# Patient Record
Sex: Male | Born: 1955 | Race: White | Hispanic: No | Marital: Single | Smoking: Never smoker
Health system: Southern US, Community
[De-identification: ages and names within clinical notes are randomized; demographics above are authoritative.]

## PROBLEM LIST (undated history)

## (undated) DIAGNOSIS — E1121 Type 2 diabetes mellitus with diabetic nephropathy: Secondary | ICD-10-CM

## (undated) DIAGNOSIS — E119 Type 2 diabetes mellitus without complications: Secondary | ICD-10-CM

## (undated) DIAGNOSIS — I38 Endocarditis, valve unspecified: Secondary | ICD-10-CM

## (undated) DIAGNOSIS — T884XXA Failed or difficult intubation, initial encounter: Secondary | ICD-10-CM

## (undated) DIAGNOSIS — Z8679 Personal history of other diseases of the circulatory system: Secondary | ICD-10-CM

## (undated) DIAGNOSIS — G4730 Sleep apnea, unspecified: Secondary | ICD-10-CM

## (undated) DIAGNOSIS — R339 Retention of urine, unspecified: Secondary | ICD-10-CM

## (undated) DIAGNOSIS — D509 Iron deficiency anemia, unspecified: Secondary | ICD-10-CM

## (undated) DIAGNOSIS — I330 Acute and subacute infective endocarditis: Secondary | ICD-10-CM

## (undated) DIAGNOSIS — M19019 Primary osteoarthritis, unspecified shoulder: Secondary | ICD-10-CM

## (undated) DIAGNOSIS — M549 Dorsalgia, unspecified: Secondary | ICD-10-CM

## (undated) DIAGNOSIS — J45909 Unspecified asthma, uncomplicated: Secondary | ICD-10-CM

## (undated) DIAGNOSIS — N35919 Unspecified urethral stricture, male, unspecified site: Secondary | ICD-10-CM

## (undated) DIAGNOSIS — T4145XA Adverse effect of unspecified anesthetic, initial encounter: Secondary | ICD-10-CM

## (undated) DIAGNOSIS — I10 Essential (primary) hypertension: Secondary | ICD-10-CM

## (undated) DIAGNOSIS — K429 Umbilical hernia without obstruction or gangrene: Secondary | ICD-10-CM

## (undated) DIAGNOSIS — M25569 Pain in unspecified knee: Secondary | ICD-10-CM

## (undated) DIAGNOSIS — N179 Acute kidney failure, unspecified: Secondary | ICD-10-CM

## (undated) DIAGNOSIS — E785 Hyperlipidemia, unspecified: Secondary | ICD-10-CM

## (undated) DIAGNOSIS — G4733 Obstructive sleep apnea (adult) (pediatric): Secondary | ICD-10-CM

## (undated) DIAGNOSIS — N182 Chronic kidney disease, stage 2 (mild): Secondary | ICD-10-CM

## (undated) DIAGNOSIS — S92309A Fracture of unspecified metatarsal bone(s), unspecified foot, initial encounter for closed fracture: Secondary | ICD-10-CM

## (undated) DIAGNOSIS — M1710 Unilateral primary osteoarthritis, unspecified knee: Secondary | ICD-10-CM

## (undated) DIAGNOSIS — T8859XA Other complications of anesthesia, initial encounter: Secondary | ICD-10-CM

## (undated) DIAGNOSIS — S93609A Unspecified sprain of unspecified foot, initial encounter: Secondary | ICD-10-CM

## (undated) DIAGNOSIS — E7800 Pure hypercholesterolemia, unspecified: Secondary | ICD-10-CM

## (undated) HISTORY — DX: Unilateral primary osteoarthritis, unspecified knee: M17.10

## (undated) HISTORY — DX: Dorsalgia, unspecified: M54.9

## (undated) HISTORY — DX: Retention of urine, unspecified: R33.9

## (undated) HISTORY — DX: Umbilical hernia without obstruction or gangrene: K42.9

## (undated) HISTORY — PX: JOINT REPLACEMENT: SHX530

## (undated) HISTORY — DX: Primary osteoarthritis, unspecified shoulder: M19.019

## (undated) HISTORY — DX: Fracture of unspecified metatarsal bone(s), unspecified foot, initial encounter for closed fracture: S92.309A

## (undated) HISTORY — PX: KNEE ARTHROSCOPY: SHX127

## (undated) HISTORY — DX: Personal history of other diseases of the circulatory system: Z86.79

## (undated) HISTORY — DX: Sleep apnea, unspecified: G47.30

## (undated) HISTORY — DX: Acute and subacute infective endocarditis: I33.0

## (undated) HISTORY — DX: Type 2 diabetes mellitus without complications: E11.9

## (undated) HISTORY — DX: Obstructive sleep apnea (adult) (pediatric): G47.33

## (undated) HISTORY — DX: Morbid (severe) obesity due to excess calories: E66.01

## (undated) HISTORY — DX: Iron deficiency anemia, unspecified: D50.9

## (undated) HISTORY — PX: KNEE ARTHROSCOPY: SUR90

## (undated) HISTORY — DX: Unspecified sprain of unspecified foot, initial encounter: S93.609A

## (undated) HISTORY — DX: Chronic kidney disease, stage 2 (mild): N18.2

## (undated) HISTORY — DX: Type 2 diabetes mellitus with diabetic nephropathy: E11.21

## (undated) HISTORY — DX: Pure hypercholesterolemia, unspecified: E78.00

## (undated) HISTORY — PX: WRIST ARTHROSCOPY: SUR100

## (undated) HISTORY — DX: Pain in unspecified knee: M25.569

## (undated) HISTORY — DX: Acute kidney failure, unspecified: N17.9

## (undated) HISTORY — PX: SHOULDER SURGERY: SHX246

## (undated) HISTORY — DX: Unspecified urethral stricture, male, unspecified site: N35.919

---
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
## 2004-11-27 ENCOUNTER — Ambulatory Visit: Payer: Self-pay | Admitting: Specialist

## 2004-12-26 ENCOUNTER — Other Ambulatory Visit: Payer: Self-pay

## 2005-01-12 ENCOUNTER — Ambulatory Visit: Payer: Self-pay

## 2005-01-13 ENCOUNTER — Ambulatory Visit: Payer: Self-pay | Admitting: Specialist

## 2005-07-23 ENCOUNTER — Ambulatory Visit: Payer: Self-pay | Admitting: Gastroenterology

## 2005-09-12 ENCOUNTER — Emergency Department: Payer: Self-pay | Admitting: Emergency Medicine

## 2005-09-16 ENCOUNTER — Emergency Department: Payer: Self-pay | Admitting: Unknown Physician Specialty

## 2005-11-12 ENCOUNTER — Ambulatory Visit: Payer: Self-pay | Admitting: Gastroenterology

## 2005-12-31 ENCOUNTER — Emergency Department: Payer: Self-pay | Admitting: Emergency Medicine

## 2006-01-12 ENCOUNTER — Ambulatory Visit: Payer: Self-pay | Admitting: Specialist

## 2006-07-13 ENCOUNTER — Emergency Department: Payer: Self-pay | Admitting: General Practice

## 2006-07-15 ENCOUNTER — Ambulatory Visit: Payer: Self-pay | Admitting: Specialist

## 2006-07-15 ENCOUNTER — Other Ambulatory Visit: Payer: Self-pay

## 2006-07-29 ENCOUNTER — Ambulatory Visit: Payer: Self-pay | Admitting: Specialist

## 2006-10-14 ENCOUNTER — Other Ambulatory Visit: Payer: Self-pay

## 2006-10-14 ENCOUNTER — Emergency Department: Payer: Self-pay | Admitting: Emergency Medicine

## 2007-03-19 ENCOUNTER — Emergency Department: Payer: Self-pay | Admitting: Emergency Medicine

## 2007-05-31 ENCOUNTER — Ambulatory Visit: Payer: Self-pay | Admitting: General Practice

## 2007-09-21 ENCOUNTER — Ambulatory Visit: Payer: Self-pay | Admitting: Unknown Physician Specialty

## 2007-11-14 ENCOUNTER — Ambulatory Visit: Payer: Self-pay | Admitting: Unknown Physician Specialty

## 2008-08-02 ENCOUNTER — Emergency Department: Payer: Self-pay | Admitting: Unknown Physician Specialty

## 2008-08-02 ENCOUNTER — Ambulatory Visit: Payer: Self-pay | Admitting: Urology

## 2008-10-24 ENCOUNTER — Ambulatory Visit: Payer: Self-pay | Admitting: Specialist

## 2009-01-01 ENCOUNTER — Ambulatory Visit: Payer: Self-pay | Admitting: Unknown Physician Specialty

## 2009-01-16 ENCOUNTER — Ambulatory Visit: Payer: Self-pay | Admitting: Unknown Physician Specialty

## 2009-02-15 ENCOUNTER — Ambulatory Visit: Payer: Self-pay | Admitting: Unknown Physician Specialty

## 2009-03-31 ENCOUNTER — Emergency Department: Payer: Self-pay | Admitting: Emergency Medicine

## 2009-04-22 ENCOUNTER — Ambulatory Visit: Payer: Self-pay | Admitting: Specialist

## 2009-05-06 ENCOUNTER — Encounter: Payer: Self-pay | Admitting: Specialist

## 2009-05-18 ENCOUNTER — Encounter: Payer: Self-pay | Admitting: Specialist

## 2009-05-27 ENCOUNTER — Other Ambulatory Visit: Payer: Self-pay | Admitting: Nephrology

## 2009-05-30 ENCOUNTER — Observation Stay: Payer: Self-pay | Admitting: Nephrology

## 2009-06-26 ENCOUNTER — Ambulatory Visit: Payer: Self-pay | Admitting: Specialist

## 2009-07-03 ENCOUNTER — Ambulatory Visit: Payer: Self-pay | Admitting: Specialist

## 2010-09-14 ENCOUNTER — Emergency Department: Payer: Self-pay | Admitting: *Deleted

## 2010-10-08 ENCOUNTER — Ambulatory Visit: Payer: Self-pay | Admitting: Specialist

## 2010-11-28 ENCOUNTER — Ambulatory Visit: Payer: Self-pay | Admitting: Otolaryngology

## 2010-12-03 ENCOUNTER — Ambulatory Visit: Payer: Self-pay | Admitting: Urology

## 2010-12-17 ENCOUNTER — Ambulatory Visit: Payer: Self-pay | Admitting: Urology

## 2011-01-02 ENCOUNTER — Ambulatory Visit: Payer: Self-pay | Admitting: Otolaryngology

## 2011-05-19 HISTORY — PX: OTHER SURGICAL HISTORY: SHX169

## 2011-10-30 DIAGNOSIS — M19019 Primary osteoarthritis, unspecified shoulder: Secondary | ICD-10-CM | POA: Insufficient documentation

## 2011-10-30 HISTORY — DX: Primary osteoarthritis, unspecified shoulder: M19.019

## 2011-12-04 ENCOUNTER — Encounter: Payer: Self-pay | Admitting: Orthopedic Surgery

## 2011-12-17 ENCOUNTER — Encounter: Payer: Self-pay | Admitting: Orthopedic Surgery

## 2012-01-05 DIAGNOSIS — M19019 Primary osteoarthritis, unspecified shoulder: Secondary | ICD-10-CM | POA: Insufficient documentation

## 2012-01-05 HISTORY — DX: Primary osteoarthritis, unspecified shoulder: M19.019

## 2012-01-12 ENCOUNTER — Ambulatory Visit: Payer: Self-pay | Admitting: General Practice

## 2012-01-17 ENCOUNTER — Ambulatory Visit: Payer: Self-pay | Admitting: General Practice

## 2012-01-17 ENCOUNTER — Encounter: Payer: Self-pay | Admitting: Orthopedic Surgery

## 2012-02-16 ENCOUNTER — Ambulatory Visit: Payer: Self-pay | Admitting: General Practice

## 2012-02-16 ENCOUNTER — Encounter: Payer: Self-pay | Admitting: Orthopedic Surgery

## 2012-03-18 ENCOUNTER — Encounter: Payer: Self-pay | Admitting: Orthopedic Surgery

## 2012-04-17 ENCOUNTER — Encounter: Payer: Self-pay | Admitting: Orthopedic Surgery

## 2012-04-26 ENCOUNTER — Ambulatory Visit: Payer: Self-pay | Admitting: General Practice

## 2012-05-18 ENCOUNTER — Encounter: Payer: Self-pay | Admitting: Orthopedic Surgery

## 2012-08-17 ENCOUNTER — Encounter: Payer: Self-pay | Admitting: Internal Medicine

## 2012-10-18 ENCOUNTER — Emergency Department: Payer: Self-pay | Admitting: Emergency Medicine

## 2012-11-06 ENCOUNTER — Emergency Department: Payer: Self-pay | Admitting: Emergency Medicine

## 2012-11-06 LAB — URINALYSIS, COMPLETE
Bacteria: NONE SEEN
Blood: NEGATIVE
Glucose,UR: 50 mg/dL (ref 0–75)
Ketone: NEGATIVE
Nitrite: NEGATIVE
RBC,UR: NONE SEEN /HPF (ref 0–5)
Specific Gravity: 1.018 (ref 1.003–1.030)
Squamous Epithelial: NONE SEEN
WBC UR: 9 /HPF (ref 0–5)

## 2012-11-06 LAB — COMPREHENSIVE METABOLIC PANEL
Albumin: 3.4 g/dL (ref 3.4–5.0)
Anion Gap: 9 (ref 7–16)
Chloride: 111 mmol/L — ABNORMAL HIGH (ref 98–107)
Co2: 21 mmol/L (ref 21–32)
EGFR (African American): >60
Glucose: 168 mg/dL — ABNORMAL HIGH (ref 65–99)
Osmolality: 289 (ref 275–301)
Potassium: 4.9 mmol/L (ref 3.5–5.1)
Total Protein: 6.8 g/dL (ref 6.4–8.2)

## 2012-11-06 LAB — CBC
HGB: 13.3 g/dL (ref 13.0–18.0)
MCHC: 35.1 g/dL (ref 32.0–36.0)
Platelet: 200 10*3/uL (ref 150–440)
RBC: 4.44 10*6/uL (ref 4.40–5.90)

## 2012-11-06 LAB — LIPASE, BLOOD: Lipase: 354 U/L (ref 73–393)

## 2012-12-09 ENCOUNTER — Ambulatory Visit: Payer: Self-pay | Admitting: General Practice

## 2012-12-26 DIAGNOSIS — M549 Dorsalgia, unspecified: Secondary | ICD-10-CM

## 2012-12-26 HISTORY — DX: Dorsalgia, unspecified: M54.9

## 2012-12-28 ENCOUNTER — Ambulatory Visit: Payer: Self-pay

## 2013-02-02 ENCOUNTER — Encounter: Payer: Self-pay | Admitting: Physical Medicine and Rehabilitation

## 2013-02-15 ENCOUNTER — Encounter: Payer: Self-pay | Admitting: Physical Medicine and Rehabilitation

## 2013-03-07 ENCOUNTER — Ambulatory Visit: Payer: Self-pay | Admitting: Physical Medicine and Rehabilitation

## 2013-06-19 ENCOUNTER — Ambulatory Visit: Payer: Self-pay | Admitting: General Practice

## 2013-10-27 ENCOUNTER — Ambulatory Visit: Payer: Self-pay | Admitting: Specialist

## 2013-11-13 ENCOUNTER — Ambulatory Visit: Payer: Self-pay | Admitting: Specialist

## 2013-11-21 ENCOUNTER — Ambulatory Visit: Payer: Self-pay | Admitting: Specialist

## 2013-12-26 IMAGING — CT CT ABD-PELV W/O CM
1 of 2 series · 14 of 32 positions shown, 18 images · non-contrast
Comparison: none

REASON FOR EXAM: (1) left flank pain; (2) left flank pain
COMMENTS:

[Series 2: 3mm soft tissue · axial · 0.98mm/px · z∈[-458,-40]mm · 14 of 159 slices shown, 18 images]
[im 13/159  soft-tissue]
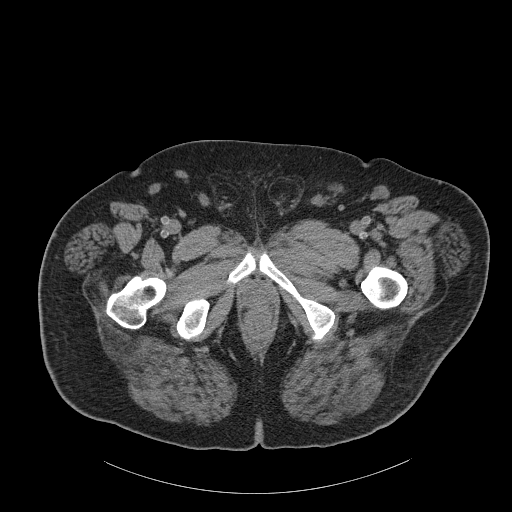
[im 13/159  bone]
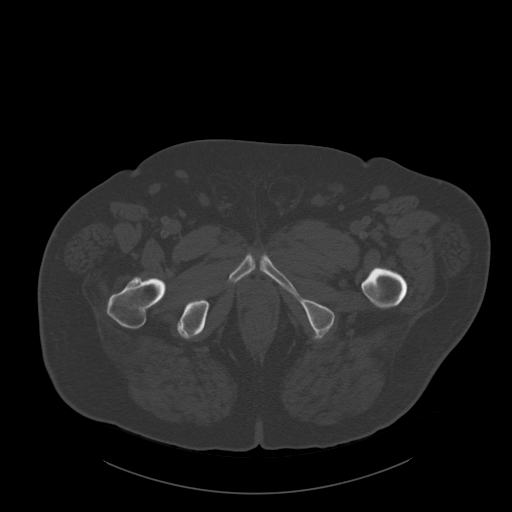
[im 26/159  soft-tissue]
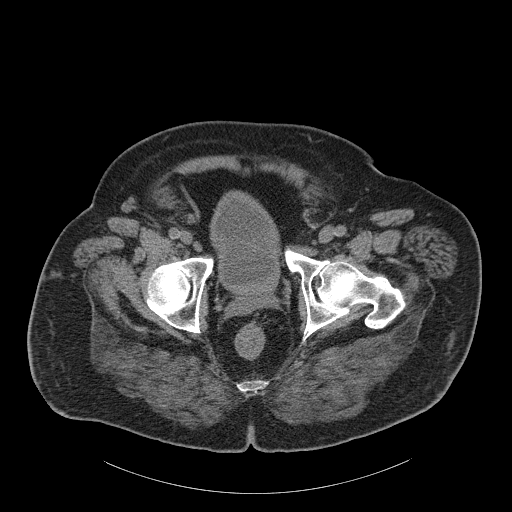
[im 38/159  soft-tissue]
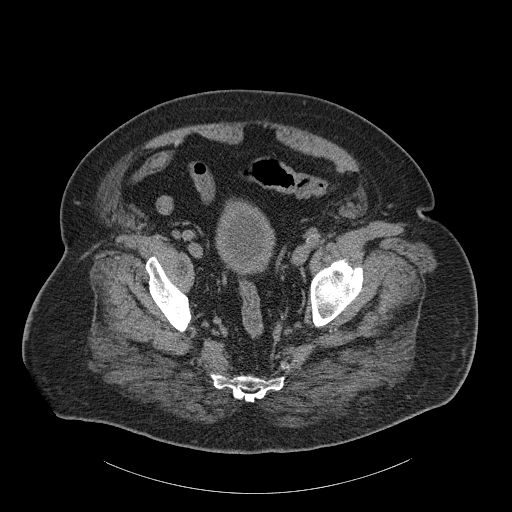
[im 51/159  soft-tissue]
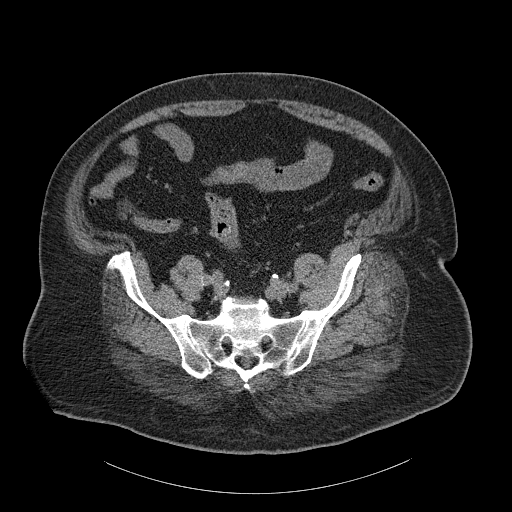
[im 64/159  soft-tissue]
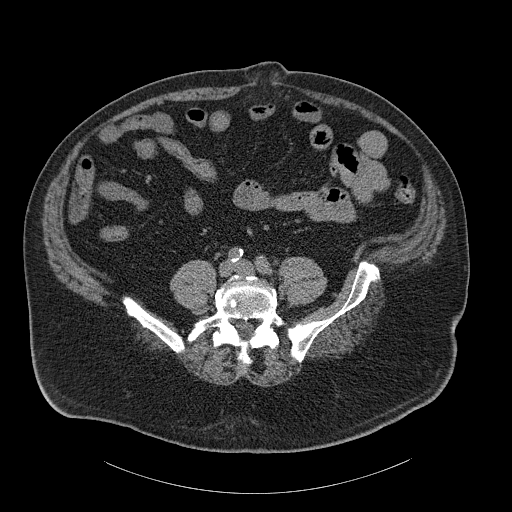
[im 76/159  soft-tissue]
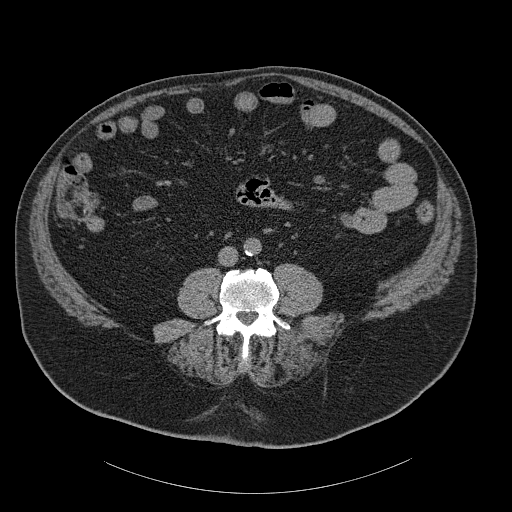
[im 89/159  soft-tissue]
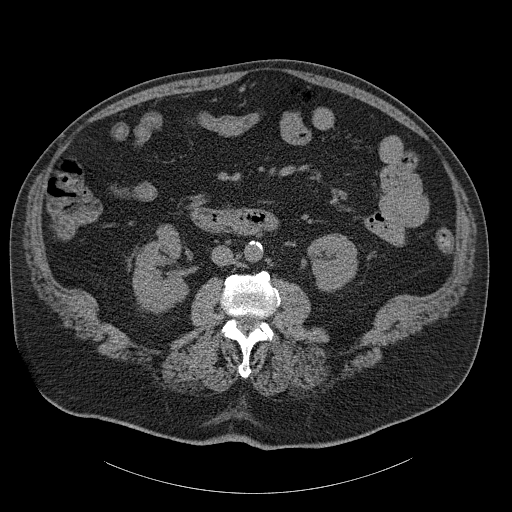
[im 102/159  soft-tissue]
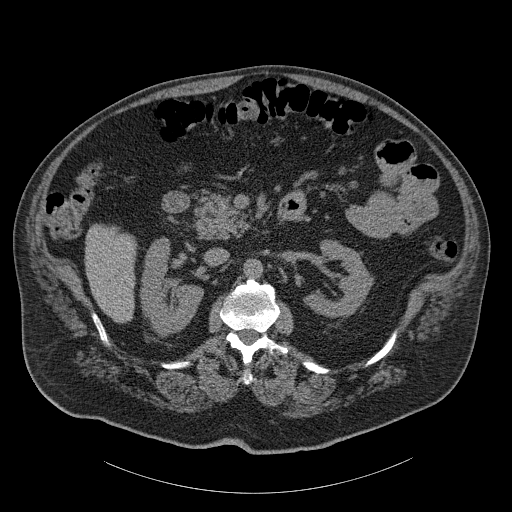
[im 114/159  soft-tissue]
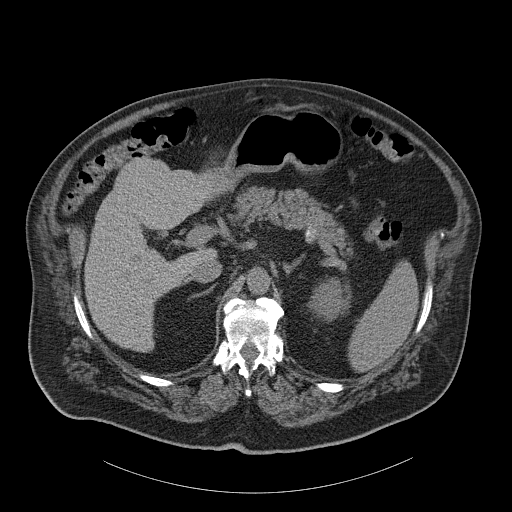
[im 114/159  bone]
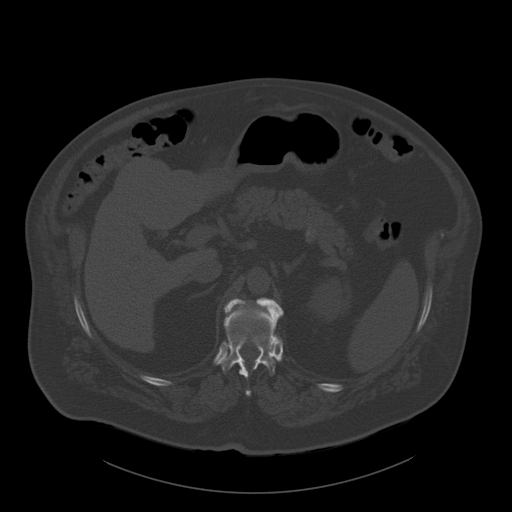
[im 127/159  soft-tissue]
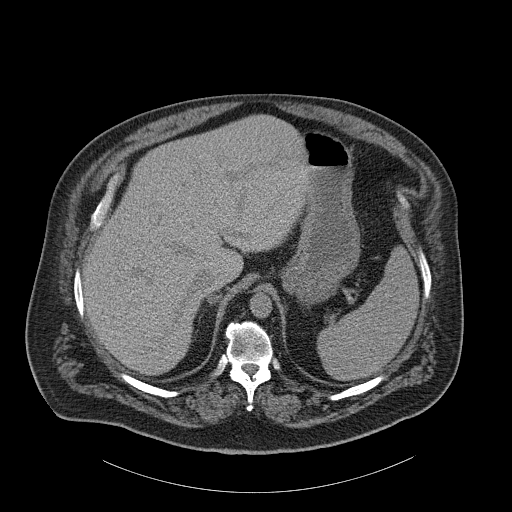
[im 133/159  lung]
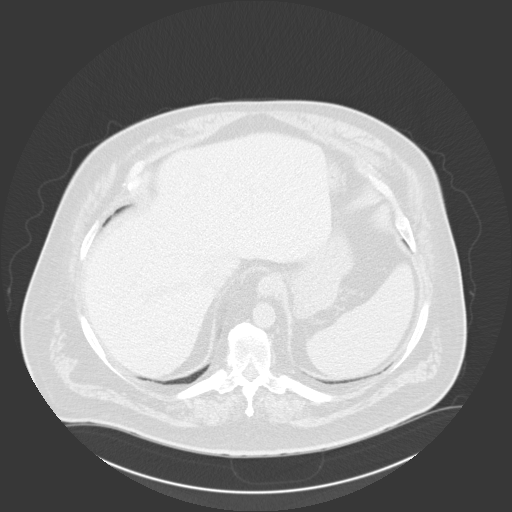
[im 140/159  soft-tissue]
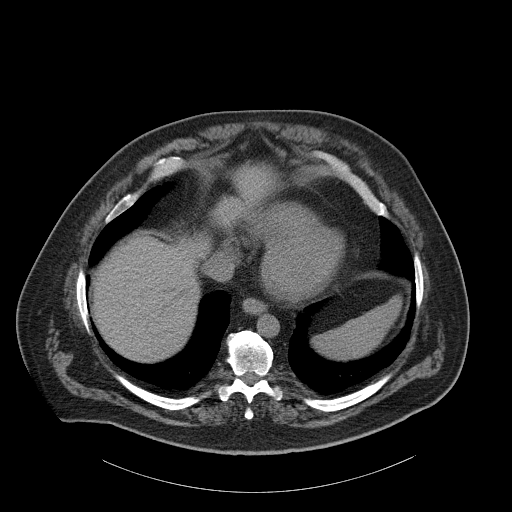
[im 140/159  lung]
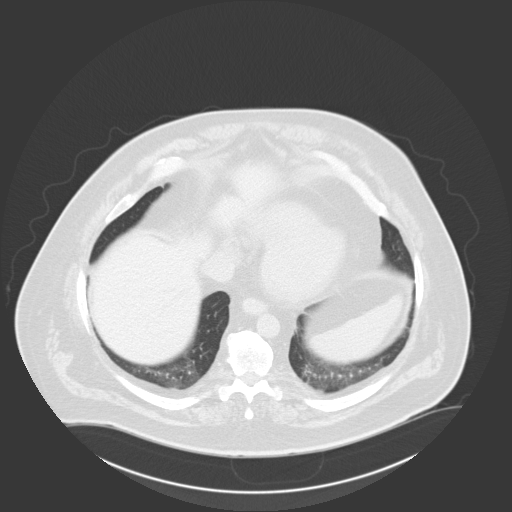
[im 146/159  lung]
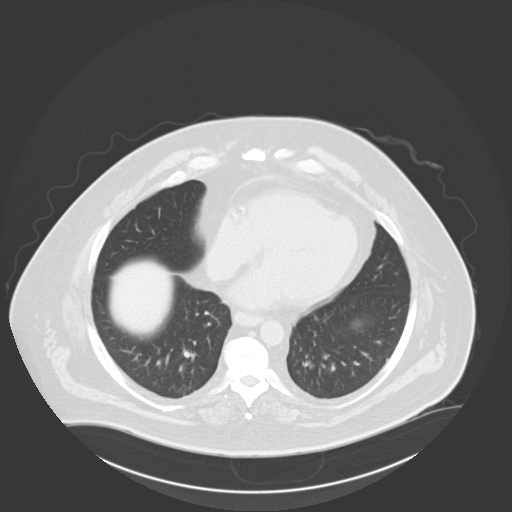
[im 152/159  soft-tissue]
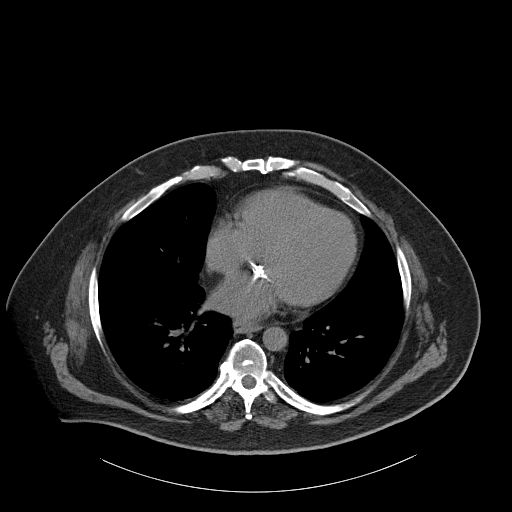
[im 152/159  lung]
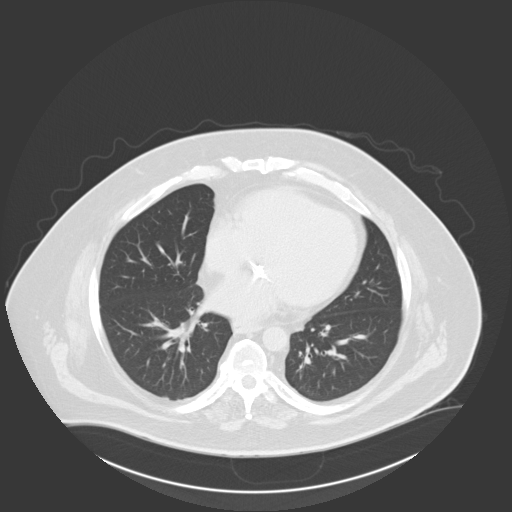

[14 of 32 positions shown; findings below may reference images not displayed]

PROCEDURE:     CT  - CT ABDOMEN AND PELVIS W[DATE]  [DATE]

RESULT:     Axial noncontrast CT scanning was performed through the abdomen
and pelvis with reconstructions at 3 mm intervals and slice thicknesses.
Review of multiplanar reconstructed images was performed separately on the
VIA monitor. Comparison is made to the study September 14, 2010.

The kidneys are normal in contour. Neither exhibits evidence of calcified
stones nor of obstruction. Mildly increased density in the perinephric fat
is stable. Along the course of the ureters no calcified stones are
demonstrated. The partially distended urinary bladder is normal in
appearance. The prostate gland and seminal vesicles also appear normal. The
psoas musculature is normal in density and contour.

The gallbladder is surgically absent. The liver, spleen, stomach, pancreas,
and adrenal glands are normal in appearance. The caliber of the abdominal
aorta is normal. The unopacified loops of small and large bowel exhibit no
evidence of ileus or obstruction. A normal calibered uninflamed appearing
appendix is demonstrated. There is an umbilical hernia containing only fat
which has increased in size since the previous study and now measures
approximately 3.7 cm in greatest transverse dimension. There is no evidence
of ascites. There is no inguinal hernia. Lumbar vertebral bodies are
preserved in height. The lung bases exhibit no more than minimal compressive
atelectasis.
IMPRESSION: 1. There is no evidence of urinary tract stones or obstruction. No
perinephric inflammatory changes are demonstrated. The partially distended
urinary bladder exhibits no acute abnormality. There is no psoas abscess.
2. There is no acute the bowel abnormality. A normal calibered appendix is
demonstrated. There is a small umbilical hernia which has increased in size
but which contains only fat.
3. The gallbladder is surgically absent. No acute hepatobiliary abnormality
straight.
4. There is no intra-abdominal or pelvic abscess or soft tissue mass.

A preliminary report was sent to the [HOSPITAL] the conclusion
of the study.

[REDACTED]

## 2014-01-29 DIAGNOSIS — I330 Acute and subacute infective endocarditis: Secondary | ICD-10-CM | POA: Insufficient documentation

## 2014-01-29 DIAGNOSIS — I10 Essential (primary) hypertension: Secondary | ICD-10-CM | POA: Insufficient documentation

## 2014-01-29 DIAGNOSIS — D509 Iron deficiency anemia, unspecified: Secondary | ICD-10-CM

## 2014-01-29 HISTORY — DX: Acute and subacute infective endocarditis: I33.0

## 2014-01-29 HISTORY — DX: Iron deficiency anemia, unspecified: D50.9

## 2014-02-12 DIAGNOSIS — G4730 Sleep apnea, unspecified: Secondary | ICD-10-CM

## 2014-02-12 DIAGNOSIS — Z8679 Personal history of other diseases of the circulatory system: Secondary | ICD-10-CM | POA: Insufficient documentation

## 2014-02-12 DIAGNOSIS — N182 Chronic kidney disease, stage 2 (mild): Secondary | ICD-10-CM

## 2014-02-12 HISTORY — DX: Sleep apnea, unspecified: G47.30

## 2014-02-12 HISTORY — DX: Personal history of other diseases of the circulatory system: Z86.79

## 2014-02-12 HISTORY — DX: Morbid (severe) obesity due to excess calories: E66.01

## 2014-02-12 HISTORY — DX: Chronic kidney disease, stage 2 (mild): N18.2

## 2014-03-08 ENCOUNTER — Emergency Department: Payer: Self-pay | Admitting: Emergency Medicine

## 2014-03-08 LAB — URINALYSIS, COMPLETE
BLOOD: NEGATIVE
Bilirubin,UR: NEGATIVE
GLUCOSE, UR: NEGATIVE mg/dL (ref 0–75)
Ketone: NEGATIVE
NITRITE: POSITIVE
PH: 5 (ref 4.5–8.0)
Protein: 100
SPECIFIC GRAVITY: 1.012 (ref 1.003–1.030)
Squamous Epithelial: <1
Transitional Epi: <1

## 2014-08-08 IMAGING — CR DG FOOT COMPLETE 3+V*L*
1 series · 3 of 3 positions shown · non-contrast
Comparison: None.

CLINICAL DATA: History of trauma, presenting with pain

EXAM:
LEFT FOOT - COMPLETE 3+ VIEW

[Series 1: x foot ap left · 0.14mm/px · 3 of 3 slices shown]
[im 1/3]
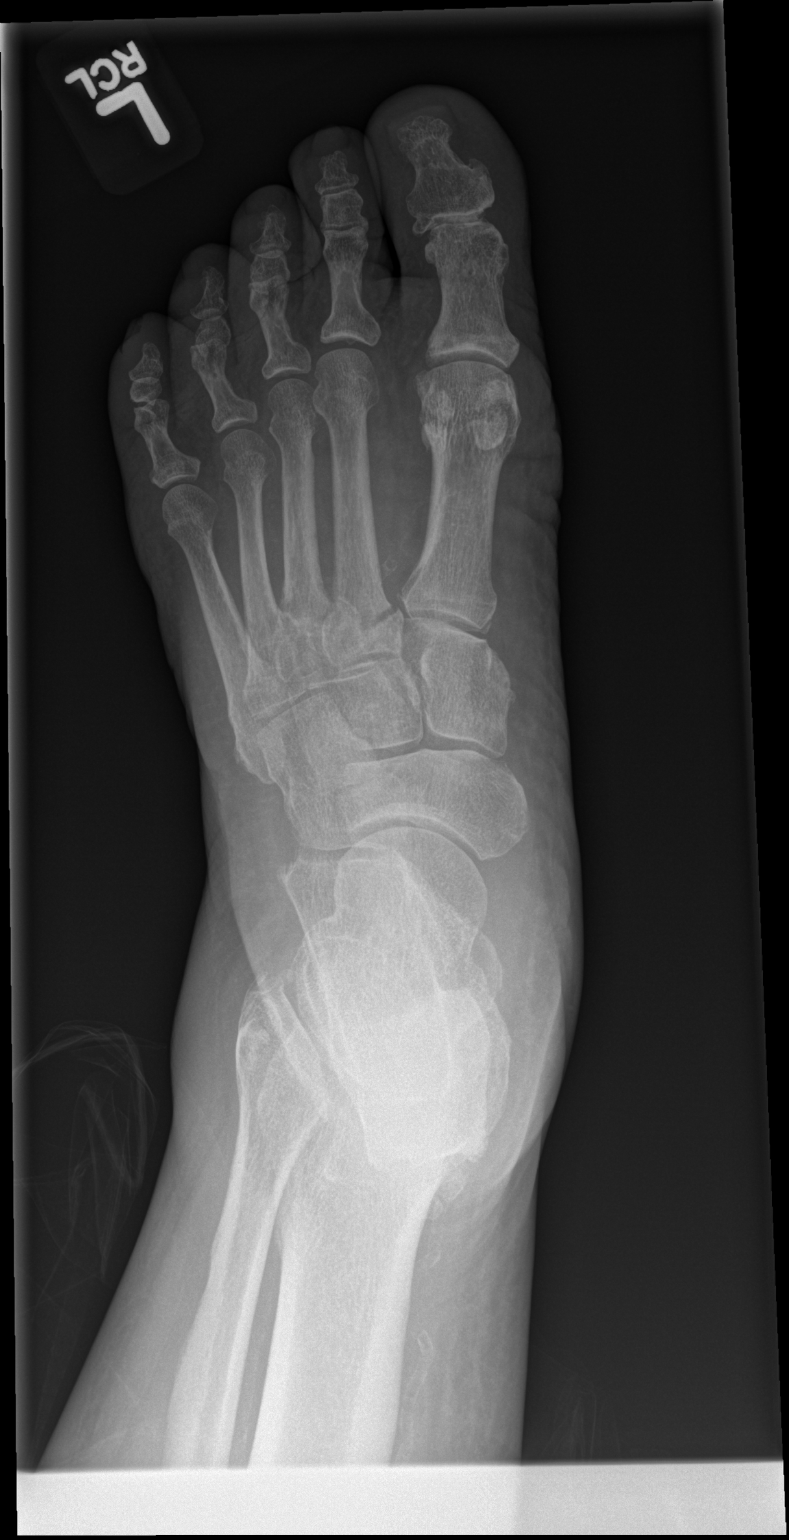
[im 2/3]
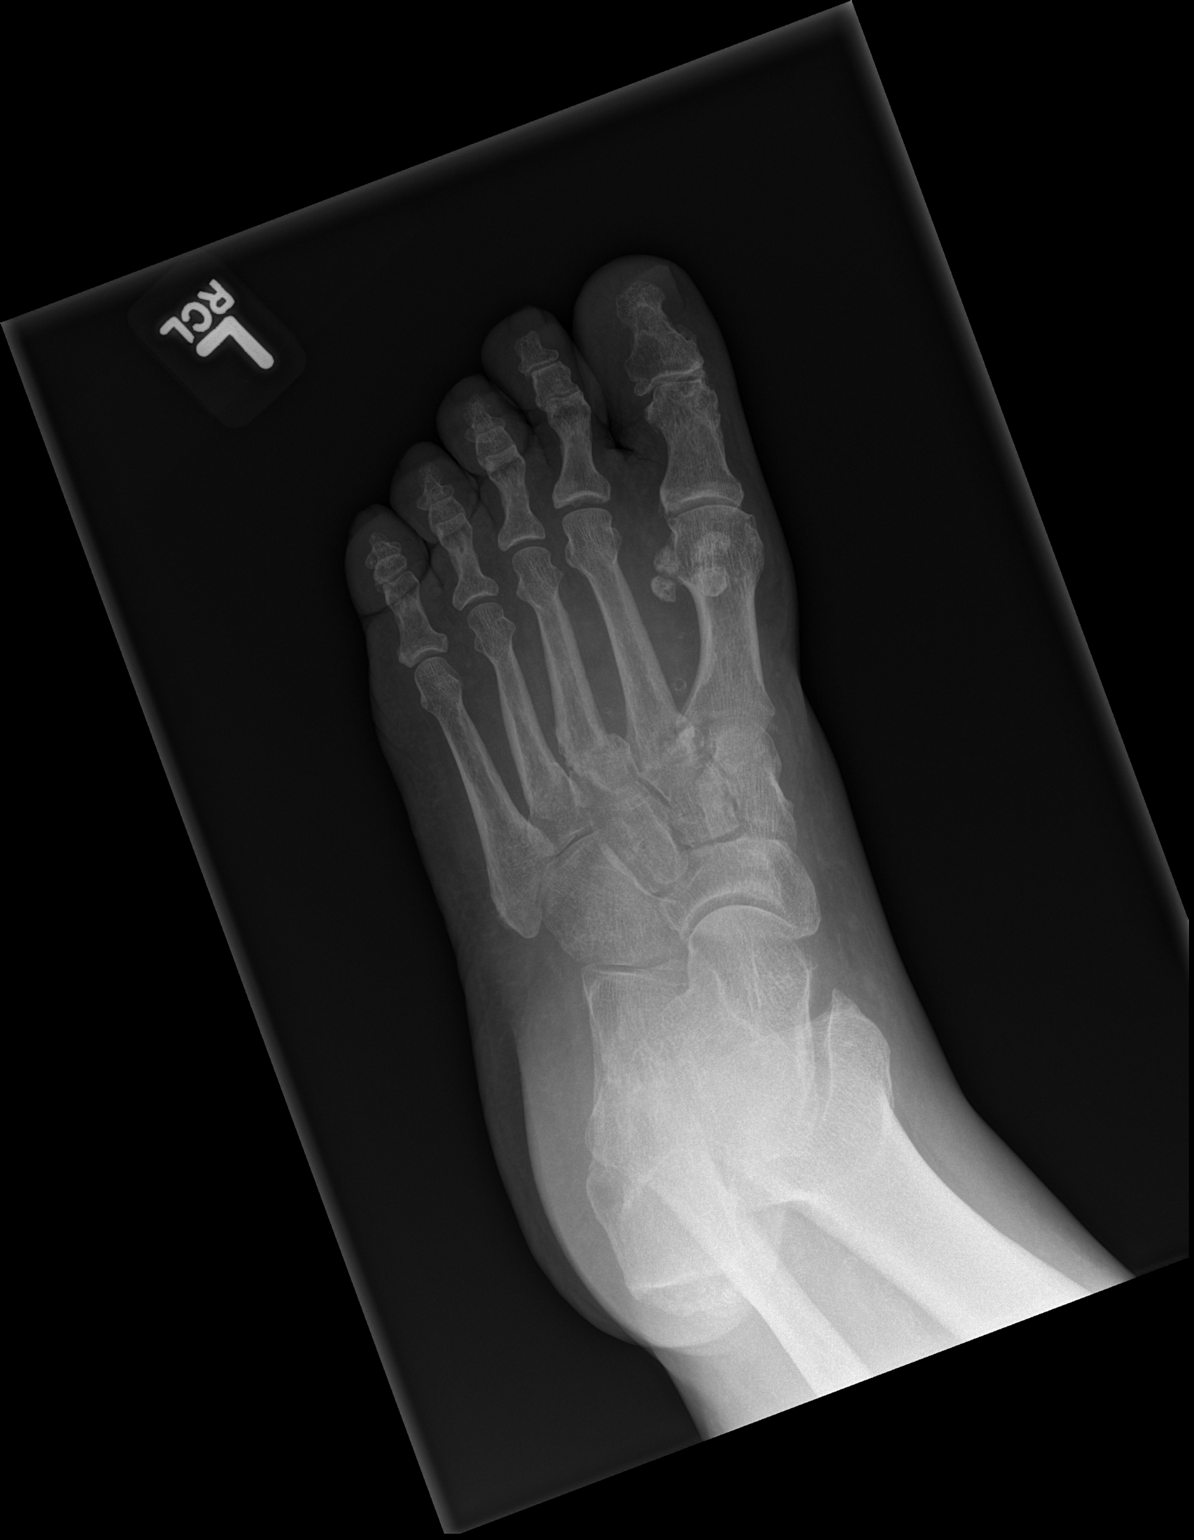
[im 3/3]
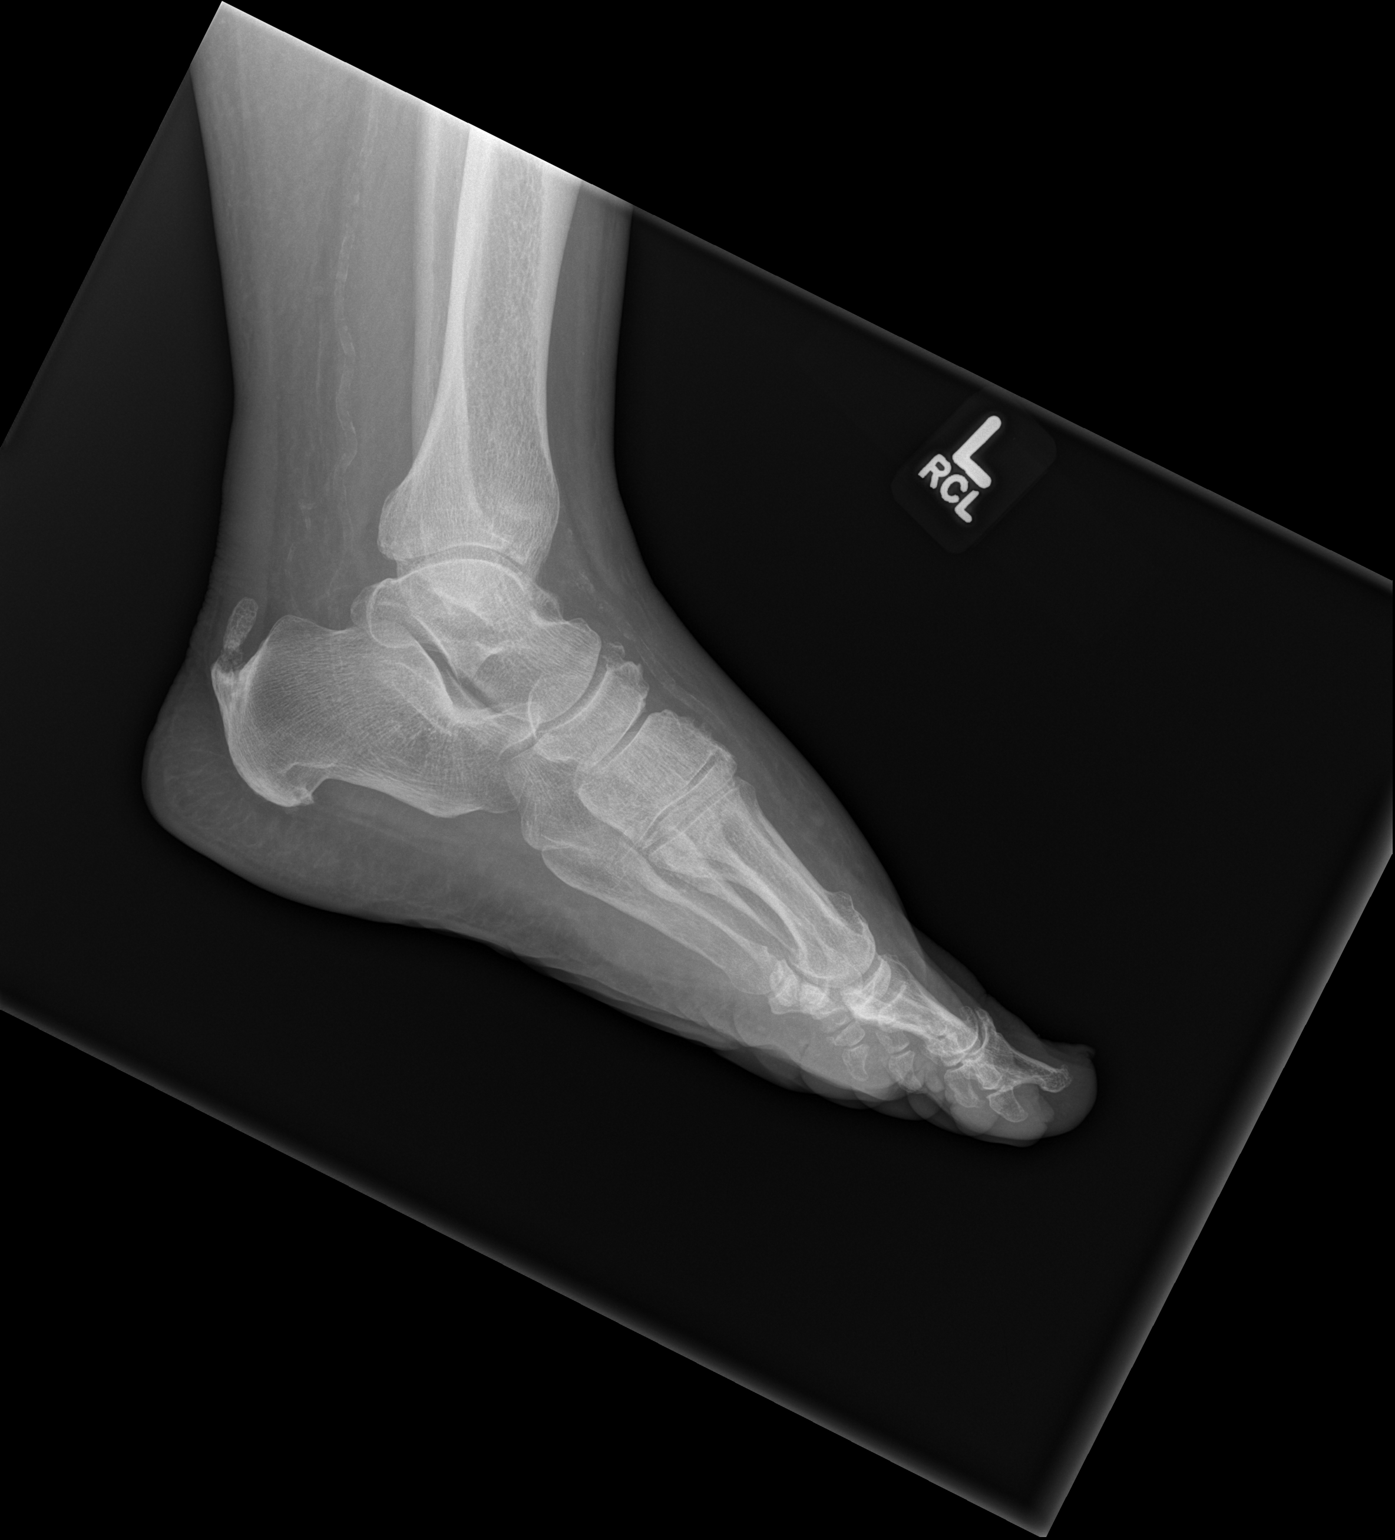

[3 of 3 positions shown; findings below may reference images not displayed]

FINDINGS: An oblique lucency projects along the medial base of the second
metatarsal, the borders appear to be partially corticated. This
appreciated primarily on the AP view and is concerning for may
chronic fracture with a component of nonunion. Degenerative changes
appreciated on the proximal and distal interphalangeal joints.
Atherosclerotic calcifications are appreciated.
IMPRESSION: 1. Findings concerning for a chronic fracture with a component of
nonunion in the base of the second metatarsal. Clinical correlation
is recommended, if clinically warranted further evaluation with
noncontrast an MRI is recommended.

## 2014-08-12 DIAGNOSIS — N35919 Unspecified urethral stricture, male, unspecified site: Secondary | ICD-10-CM

## 2014-08-12 DIAGNOSIS — Z87448 Personal history of other diseases of urinary system: Secondary | ICD-10-CM | POA: Insufficient documentation

## 2014-08-12 HISTORY — DX: Unspecified urethral stricture, male, unspecified site: N35.919

## 2014-09-08 NOTE — Op Note (Signed)
PATIENT NAME:  Ruben Ward, Ruben Ward MR#:  [id number] DATE OF BIRTH:  Apr 13, 1956  DATE OF PROCEDURE:  11/21/2013  PREOPERATIVE DIAGNOSIS: Lateral meniscus tear, left knee.   POSTOPERATIVE DIAGNOSES: 1.  Lateral meniscus tear, left knee.  2.  Medial meniscus tear, left knee.  3.  Partial tear anterior cruciate ligament.   PROCEDURES: 1.  Arthroscopic partial lateral meniscectomy, left knee.  2.  Arthroscopic partial medial meniscectomy.  3.  Arthroscopic debridement partial anterior cruciate ligament tear.   SURGEON: Myra Rudehristopher Chenille Toor, M.D.   ANESTHESIA: Spinal.   COMPLICATIONS: None.   DESCRIPTION OF PROCEDURE: After adequate induction of spinal anesthesia, the left lower extremity is secured in the legholder in the usual manner for arthroscopy. The left knee and leg are thoroughly prepped with alcohol and ChloraPrep and draped in standard sterile fashion. The joint is infiltrated with 10 mL of Marcaine with epinephrine. Standard portals are made and diagnostic arthroscopy is performed. There is minimal synovitis present. 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 In the medial compartment, there is a small horizontal tear of the posterior horn of the medial meniscus. Using the ArthroWand, the torn portion of the meniscus was resected. In the intercondylar notch, there is seen to be a portion of the lateral meniscus which has been extruded. This is resected with the full radial resector. There is also seen to be a 50% tear of the anterior cruciate ligament and the strands of the torn ligament are resected. The lateral compartment is then visualized and there is another bucket handle portion of the meniscus which is torn and this was resected using the full radial resector and the ArthroWand. The dissection was then carried around to the lateral gutter where some portions of extruded meniscus were present and these  were excised. The joint is thoroughly irrigated multiple times. Skin edges are closed with 5-0 nylon. A soft bulky dressing is applied. Prior to applying the dressing, the joint was infiltrated with 20 mL of Marcaine with epinephrine with 4 mg of morphine. The patient is returned to the recovery room in satisfactory condition having tolerated the procedure quite well. ____________________________ Clare Gandyhristopher E. Kimia Finan, MD ces:sb D: 11/21/2013 10:28:10 ET T: 11/21/2013 10:39:52 ET JOB#: [id number]  cc: Clare Gandyhristopher E. Seanpatrick Maisano, MD, <Dictator> Clare GandyHRISTOPHER E Ej Pinson MD ELECTRONICALLY SIGNED 12/14/2013 13:23

## 2015-04-15 ENCOUNTER — Other Ambulatory Visit: Payer: Self-pay | Admitting: Specialist

## 2015-04-15 DIAGNOSIS — M7742 Metatarsalgia, left foot: Secondary | ICD-10-CM

## 2015-04-23 ENCOUNTER — Ambulatory Visit
Admission: RE | Admit: 2015-04-23 | Discharge: 2015-04-23 | Disposition: A | Discharge status: Home / Self Care | Payer: BLUE CROSS/BLUE SHIELD | Source: Ambulatory Visit | Attending: Specialist | Admitting: Specialist

## 2015-04-23 DIAGNOSIS — M67874 Other specified disorders of tendon, left ankle and foot: Secondary | ICD-10-CM | POA: Insufficient documentation

## 2015-04-23 DIAGNOSIS — M19072 Primary osteoarthritis, left ankle and foot: Secondary | ICD-10-CM | POA: Insufficient documentation

## 2015-04-23 DIAGNOSIS — M7741 Metatarsalgia, right foot: Secondary | ICD-10-CM | POA: Insufficient documentation

## 2015-04-23 DIAGNOSIS — M7742 Metatarsalgia, left foot: Secondary | ICD-10-CM | POA: Insufficient documentation

## 2015-06-08 ENCOUNTER — Emergency Department
Admission: EM | Admit: 2015-06-08 | Discharge: 2015-06-08 | Disposition: A | Discharge status: Home / Self Care | Payer: BLUE CROSS/BLUE SHIELD | Attending: Emergency Medicine | Admitting: Emergency Medicine

## 2015-06-08 ENCOUNTER — Encounter: Payer: Self-pay | Admitting: Emergency Medicine

## 2015-06-08 DIAGNOSIS — Y998 Other external cause status: Secondary | ICD-10-CM | POA: Insufficient documentation

## 2015-06-08 DIAGNOSIS — Y9389 Activity, other specified: Secondary | ICD-10-CM | POA: Diagnosis not present

## 2015-06-08 DIAGNOSIS — X58XXXA Exposure to other specified factors, initial encounter: Secondary | ICD-10-CM | POA: Diagnosis not present

## 2015-06-08 DIAGNOSIS — Y9289 Other specified places as the place of occurrence of the external cause: Secondary | ICD-10-CM | POA: Insufficient documentation

## 2015-06-08 DIAGNOSIS — E119 Type 2 diabetes mellitus without complications: Secondary | ICD-10-CM | POA: Insufficient documentation

## 2015-06-08 DIAGNOSIS — I10 Essential (primary) hypertension: Secondary | ICD-10-CM | POA: Diagnosis not present

## 2015-06-08 DIAGNOSIS — S00412A Abrasion of left ear, initial encounter: Secondary | ICD-10-CM | POA: Diagnosis not present

## 2015-06-08 DIAGNOSIS — S00402A Unspecified superficial injury of left ear, initial encounter: Secondary | ICD-10-CM | POA: Diagnosis present

## 2015-06-08 HISTORY — DX: Essential (primary) hypertension: I10

## 2015-06-08 HISTORY — DX: Type 2 diabetes mellitus without complications: E11.9

## 2015-06-08 NOTE — ED Notes (Signed)
States had scab L earlobe, scratched it and it began to bleed. Small amount of dried blood noted below ear.

## 2015-06-08 NOTE — ED Provider Notes (Signed)
CSN: [id number]     Arrival date & time 06/08/15  1723 History   First MD Initiated Contact with Patient 06/08/15 1745     Chief Complaint  Patient presents with  . Abrasion     (Consider location/radiation/quality/duration/timing/severity/associated sxs/prior Treatment) HPI  60 year old male presents to the emergency department for evaluation of left earlobe abrasion. Approximately one hour ago, he scratched his ear, pulled off a scab and developed bleeding to the left earlobe. Patient denies any pain or discomfort. There is been no trauma. Earlobe continue to bleed so he decided to come to the emergency department. Patient did not apply a Band-Aid or dressing. He continued to frequently wipe the blood away. Patient is not on blood thinners.  Past Medical History  Diagnosis Date  . Diabetes mellitus without complication (HCC)   . Hypertension    Past Surgical History  Procedure Laterality Date  . Shoulder surgery     No family history on file. Social History  Substance Use Topics  . Smoking status: Never Smoker   . Smokeless tobacco: None  . Alcohol Use: No    Review of Systems  Constitutional: Negative.  Negative for fever, chills, activity change and appetite change.  HENT: Negative for congestion, ear pain, mouth sores, rhinorrhea, sinus pressure, sore throat and trouble swallowing.   Eyes: Negative for photophobia, pain and discharge.  Respiratory: Negative for cough, chest tightness and shortness of breath.   Cardiovascular: Negative for chest pain and leg swelling.  Gastrointestinal: Negative for nausea, vomiting, abdominal pain, diarrhea and abdominal distention.  Genitourinary: Negative for dysuria and difficulty urinating.  Musculoskeletal: Negative for back pain, arthralgias and gait problem.  Skin: Positive for wound. Negative for color change and rash.  Neurological: Negative for dizziness and headaches.  Hematological: Negative for adenopathy.   Psychiatric/Behavioral: Negative for behavioral problems and agitation.      Allergies  Metformin and related  Home Medications   Prior to Admission medications   Not on File   BP 185/61 mmHg  Pulse 59  Temp(Src) 99 F (37.2 C) (Oral)  Resp 18  Ht  (1.626 m)  Wt 131.543 kg  BMI 49.75 kg/m2  SpO2 95% Physical Exam  Constitutional: He is oriented to person, place, and time. He appears well-developed and well-nourished.  HENT:  Head: Normocephalic and atraumatic.  Right Ear: External ear normal.  Nose: Nose normal.  Mouth/Throat: Oropharynx is clear and moist.  Small 0.5 cm in length abrasion to the left earlobe. There is no warmth erythema or drainage or signs of infection. Bleeding controlled at this time. Abrasion cleansed, Band-Aid applied.  Eyes: Conjunctivae and EOM are normal. Pupils are equal, round, and reactive to light.  Neck: Normal range of motion. Neck supple.  Cardiovascular: Normal rate and intact distal pulses.   Pulmonary/Chest: Effort normal. No respiratory distress.  Musculoskeletal: Normal range of motion. He exhibits no edema or tenderness.  Neurological: He is alert and oriented to person, place, and time.  Skin: Skin is warm and dry.  Psychiatric: He has a normal mood and affect. His behavior is normal. Judgment and thought content normal.    ED Course  Procedures (including critical care time) Labs Review Labs Reviewed - No data to display  Imaging Review No results found. I have personally reviewed and evaluated these images and lab results as part of my medical decision-making.   EKG Interpretation None      MDM   Final diagnoses:  Ear abrasion, left, initial encounter  60 year old male with abrasion to the left earlobe. Well-controlled, Band-Aid applied. No signs of infection. Return to the ER for any worsening symptoms urgent changes in his health.    Evon Slack, PA-C 06/08/15 1802  Minna Antis,  MD 06/08/15 2233

## 2015-06-08 NOTE — Discharge Instructions (Signed)
Wound Care Taking care of your wound properly can help to prevent pain and infection. It can also help your wound to heal more quickly.  HOW TO CARE FOR YOUR WOUND  Take or apply over-the-counter and prescription medicines only as told by your health care provider.  If you were prescribed antibiotic medicine, take or apply it as told by your health care provider. Do not stop using the antibiotic even if your condition improves.  Clean the wound each day or as told by your health care provider.  Wash the wound with mild soap and water.  Rinse the wound with water to remove all soap.  Pat the wound dry with a clean towel. Do not rub it.  There are many different ways to close and cover a wound. For example, a wound can be covered with stitches (sutures), skin glue, or adhesive strips. Follow instructions from your health care provider about:  How to take care of your wound.  When and how you should change your bandage (dressing).  When you should remove your dressing.  Removing whatever was used to close your wound.  Check your wound every day for signs of infection. Watch for:  Redness, swelling, or pain.  Fluid, blood, or pus.  Keep the dressing dry until your health care provider says it can be removed. Do not take baths, swim, use a hot tub, or do anything that would put your wound underwater until your health care provider approves.  Raise (elevate) the injured area above the level of your heart while you are sitting or lying down.  Do not scratch or pick at the wound.  Keep all follow-up visits as told by your health care provider. This is important. SEEK MEDICAL CARE IF:  You received a tetanus shot and you have swelling, severe pain, redness, or bleeding at the injection site.  You have a fever.  Your pain is not controlled with medicine.  You have increased redness, swelling, or pain at the site of your wound.  You have fluid, blood, or pus coming from your  wound.  You notice a bad smell coming from your wound or your dressing. SEEK IMMEDIATE MEDICAL CARE IF:  You have a red streak going away from your wound.   This information is not intended to replace advice given to you by your health care provider. Make sure you discuss any questions you have with your health care provider.   Document Released: 02/11/2008 Document Revised: 09/18/2014 Document Reviewed: 04/30/2014 Elsevier Interactive Patient Education 2016 [employer]  Abrasion An abrasion is a cut or scrape on the outer surface of your skin. An abrasion does not extend through all of the layers of your skin. It is important to care for your abrasion properly to prevent infection. CAUSES Most abrasions are caused by falling on or gliding across the ground or another surface. When your skin rubs on something, the outer and inner layer of skin rubs off.  SYMPTOMS A cut or scrape is the main symptom of this condition. The scrape may be bleeding, or it may appear red or pink. If there was an associated fall, there may be an underlying bruise. DIAGNOSIS An abrasion is diagnosed with a physical exam. TREATMENT Treatment for this condition depends on how large and deep the abrasion is. Usually, your abrasion will be cleaned with water and mild soap. This removes any dirt or debris that may be stuck. An antibiotic ointment may be applied to the abrasion to help  prevent infection. A bandage (dressing) may be placed on the abrasion to keep it clean. You may also need a tetanus shot. HOME CARE INSTRUCTIONS Medicines  Take or apply medicines only as directed by your health care provider.  If you were prescribed an antibiotic ointment, finish all of it even if you start to feel better. Wound Care  Clean the wound with mild soap and water 2-3 times per day or as directed by your health care provider. Pat your wound dry with a clean towel. Do not rub it.  There are many different ways to close  and cover a wound. Follow instructions from your health care provider about:  Wound care.  Dressing changes and removal.  Check your wound every day for signs of infection. Watch for:  Redness, swelling, or pain.  Fluid, blood, or pus. General Instructions  Keep the dressing dry as directed by your health care provider. Do not take baths, swim, use a hot tub, or do anything that would put your wound underwater until your health care provider approves.  If there is swelling, raise (elevate) the injured area above the level of your heart while you are sitting or lying down.  Keep all follow-up visits as directed by your health care provider. This is important. SEEK MEDICAL CARE IF:  You received a tetanus shot and you have swelling, severe pain, redness, or bleeding at the injection site.  Your pain is not controlled with medicine.  You have increased redness, swelling, or pain at the site of your wound. SEEK IMMEDIATE MEDICAL CARE IF:  You have a red streak going away from your wound.  You have a fever.  You have fluid, blood, or pus coming from your wound.  You notice a bad smell coming from your wound or your dressing.   This information is not intended to replace advice given to you by your health care provider. Make sure you discuss any questions you have with your health care provider.   Document Released: 02/11/2005 Document Revised: 01/23/2015 Document Reviewed: 05/02/2014 Elsevier Interactive Patient Education [employer]

## 2015-06-12 MED ORDER — ONDANSETRON HCL 4 MG/2ML IJ SOLN
INTRAMUSCULAR | Status: AC
  Filled 2015-06-12: qty 2

## 2015-08-14 DIAGNOSIS — E1121 Type 2 diabetes mellitus with diabetic nephropathy: Secondary | ICD-10-CM | POA: Insufficient documentation

## 2015-08-14 HISTORY — DX: Type 2 diabetes mellitus with diabetic nephropathy: E11.21

## 2015-10-05 ENCOUNTER — Emergency Department
Admission: EM | Admit: 2015-10-05 | Discharge: 2015-10-05 | Disposition: A | Discharge status: Home / Self Care | Payer: BLUE CROSS/BLUE SHIELD | Attending: Student | Admitting: Student

## 2015-10-05 ENCOUNTER — Encounter: Payer: Self-pay | Admitting: Emergency Medicine

## 2015-10-05 DIAGNOSIS — E119 Type 2 diabetes mellitus without complications: Secondary | ICD-10-CM | POA: Diagnosis not present

## 2015-10-05 DIAGNOSIS — I10 Essential (primary) hypertension: Secondary | ICD-10-CM | POA: Insufficient documentation

## 2015-10-05 DIAGNOSIS — H9201 Otalgia, right ear: Secondary | ICD-10-CM | POA: Diagnosis present

## 2015-10-05 DIAGNOSIS — H6121 Impacted cerumen, right ear: Secondary | ICD-10-CM

## 2015-10-05 MED ORDER — CARBAMIDE PEROXIDE 6.5 % OT SOLN
5.0000 [drp] | Freq: Once | OTIC | Status: AC
  Administered 2015-10-05: 5 [drp] via OTIC

## 2015-10-05 MED ORDER — CARBAMIDE PEROXIDE 6.5 % OT SOLN
OTIC | Status: AC
  Administered 2015-10-05: 5 [drp] via OTIC
  Filled 2015-10-05: qty 15

## 2015-10-05 NOTE — ED Notes (Signed)
Ear pain x 1 week. Used etoh to try to dry up any excess fluid. No improvement

## 2015-10-05 NOTE — Discharge Instructions (Signed)
Use eardrops as directed. Advised to choose over-the-counter ear irrigation kit Cerumen Impaction The structures of the external ear canal secrete a waxy substance known as cerumen. Excess cerumen can build up in the ear canal, causing a condition known as cerumen impaction. Cerumen impaction can cause ear pain and disrupt the function of the ear. The rate of cerumen production differs for each individual. In certain individuals, the configuration of the ear canal may decrease his or her ability to naturally remove cerumen. CAUSES Cerumen impaction is caused by excessive cerumen production or buildup. RISK FACTORS  Frequent use of swabs to clean ears.  Having narrow ear canals.  Having eczema.  Being dehydrated. SIGNS AND SYMPTOMS  Diminished hearing.  Ear drainage.  Ear pain.  Ear itch. TREATMENT Treatment may involve:  Over-the-counter or prescription ear drops to soften the cerumen.  Removal of cerumen by a health care provider. This may be done with:  Irrigation with warm water. This is the most common method of removal.  Ear curettes and other instruments.  Surgery. This may be done in severe cases. HOME CARE INSTRUCTIONS  Take medicines only as directed by your health care provider.  Do not insert objects into the ear with the intent of cleaning the ear. PREVENTION  Do not insert objects into the ear, even with the intent of cleaning the ear. Removing cerumen as a part of normal hygiene is not necessary, and the use of swabs in the ear canal is not recommended.  Drink enough water to keep your urine clear or pale yellow.  Control your eczema if you have it. SEEK MEDICAL CARE IF:  You develop ear pain.  You develop bleeding from the ear.  The cerumen does not clear after you use ear drops as directed.   This information is not intended to replace advice given to you by your health care provider. Make sure you discuss any questions you have with your health  care provider.   Document Released: 06/11/2004 Document Revised: 05/25/2014 Document Reviewed: 12/19/2014 Elsevier Interactive Patient Education 2016 [place] Drops, Adult You need to put eardrops in your ear. HOME CARE   Put drops in your affected ear as told.  After putting in the drops, lie down with the ear you put the drops in facing up. Stay this way for 10 minutes. Use the ear drops as long as your doctor tells you.  Before you get up, put a cotton ball gently in your ear. Do not push it far in your ear.  Do not wash out your ears unless your doctor says it is okay.  Finish all medicines as told by your doctor. You may be told to keep using the eardrops even if you start to feel better.  See your doctor as told for follow-up visits. GET HELP IF:  You have pain that gets worse.  Any unusual fluid (drainage) is coming from your ear (especially if the fluid stinks).  You have trouble hearing.  You get really dizzy as if the room is spinning and feel sick to your stomach (vertigo).  The outside of your ear becomes red or puffy or both. This may be a sign of an allergic reaction. MAKE SURE YOU:   Understand these instructions.  Will watch your condition.  Will get help right away if you are not doing well or get worse.   This information is not intended to replace advice given to you by your health care provider. Make sure  you discuss any questions you have with your health care provider.   Document Released: 10/22/2009 Document Revised: 05/25/2014 Document Reviewed: 11/29/2012 Elsevier Interactive Patient Education [employer].

## 2015-10-05 NOTE — ED Provider Notes (Signed)
[facility] Emergency Department Provider Note   ____________________________________________  Time seen: Approximately 3:47 PM  I have reviewed the triage vital signs and the nursing notes.   HISTORY  Chief Complaint Otalgia    HPI Ruben Ward is a 60 y.o. male patient complaining of right ear pain for 1 week. Patient is using alcohol to try to drop any excess fluid. Patient states nose no improvement after using the alcohol. Patient stated there is mild hearing loss. Patient denies any URI signs symptoms. Patient rates his pain discomfort as a 5/10. No further palliative measures for his complaint.   Past Medical History  Diagnosis Date  . Diabetes mellitus without complication (HCC)   . Hypertension     There are no active problems to display for this patient.   Past Surgical History  Procedure Laterality Date  . Shoulder surgery      No current outpatient prescriptions on file.  Allergies Review of patient's allergies indicates no active allergies.  History reviewed. No pertinent family history.  Social History Social History  Substance Use Topics  . Smoking status: Never Smoker   . Smokeless tobacco: None  . Alcohol Use: No    Review of Systems Constitutional: No fever/chills Eyes: No visual changes. ENT: No sore throat. Cardiovascular: Denies chest pain. Respiratory: Denies shortness of breath. Gastrointestinal: No abdominal pain.  No nausea, no vomiting.  No diarrhea.  No constipation. Genitourinary: Negative for dysuria. Musculoskeletal: Negative for back pain. Skin: Negative for rash. Neurological: Negative for headaches, focal weakness or numbness. Endocrine:Hypertension and diabetes  ____________________________________________   PHYSICAL EXAM:  VITAL SIGNS: ED Triage Vitals  Enc Vitals Group     BP 10/05/15 1541 138/63 mmHg     Pulse Rate 10/05/15 1541 54     Resp 10/05/15 1541 18     Temp  10/05/15 1541 98.4 F (36.9 C)     Temp Source 10/05/15 1541 Oral     SpO2 10/05/15 1541 97 %     Weight 10/05/15 1541 300 lb (136.079 kg)     Height 10/05/15 1541 5\' 6"  (1.676 m)     Head Cir --      Peak Flow --      Pain Score 10/05/15 1542 5     Pain Loc --      Pain Edu? --      Excl. in GC? --     Constitutional: Alert and oriented. Well appearing and in no acute distress. Eyes: Conjunctivae are normal. PERRL. EOMI. Head: Atraumatic. Nose: No congestion/rhinnorhea. EARS: Right ear TM not observable secondary to cerebral impaction. Mouth/Throat: Mucous membranes are moist.  Oropharynx non-erythematous. Neck: No stridor.  No cervical spine tenderness to palpation. Hematological/Lymphatic/Immunilogical: No cervical lymphadenopathy. Cardiovascular: Normal rate, regular rhythm. Grossly normal heart sounds.  Good peripheral circulation. Respiratory: Normal respiratory effort.  No retractions. Lungs CTAB. Gastrointestinal: Soft and nontender. No distention. No abdominal bruits. No CVA tenderness. Musculoskeletal: No lower extremity tenderness nor edema.  No joint effusions. Neurologic:  Normal speech and language. No gross focal neurologic deficits are appreciated. No gait instability. Skin:  Skin is warm, dry and intact. No rash noted. Psychiatric: Mood and affect are normal. Speech and behavior are normal.  ____________________________________________   LABS (all labs ordered are listed, but only abnormal results are displayed)  Labs Reviewed - No data to display ____________________________________________  EKG   ____________________________________________  RADIOLOGY   ____________________________________________   PROCEDURES  Procedure(s) performed: None  Critical Care  performed: No  ____________________________________________   INITIAL IMPRESSION / ASSESSMENT AND PLAN / ED COURSE  Pertinent labs & imaging results that were available during my care of  the patient were reviewed by me and considered in my medical decision making (see chart for details). Cerumen impaction right ear. Moderate amount removed with ear irrigation. Patient given a prescription for Carbamide peroxide for home use. Patient advised to follow-up with family doctor if no improvement in 3 days. ____________________________________________   FINAL CLINICAL IMPRESSION(S) / ED DIAGNOSES  Final diagnoses:  Cerumen impaction, right      NEW MEDICATIONS STARTED DURING THIS VISIT:  New Prescriptions   No medications on file     Note:  This document was prepared using Dragon voice recognition software and may include unintentional dictation errors.    Joni Reining, PA-C 10/05/15 1614  Gayla Doss, MD 10/06/15 [phone]

## 2015-11-28 ENCOUNTER — Inpatient Hospital Stay
Admission: EM | Admit: 2015-11-28 | Discharge: 2015-12-02 | DRG: 683 | Disposition: A | Discharge status: Skilled Nursing Facility | Payer: BLUE CROSS/BLUE SHIELD | Attending: Internal Medicine | Admitting: Internal Medicine

## 2015-11-28 ENCOUNTER — Encounter: Payer: Self-pay | Admitting: Emergency Medicine

## 2015-11-28 DIAGNOSIS — Z8042 Family history of malignant neoplasm of prostate: Secondary | ICD-10-CM

## 2015-11-28 DIAGNOSIS — Z888 Allergy status to other drugs, medicaments and biological substances status: Secondary | ICD-10-CM

## 2015-11-28 DIAGNOSIS — N179 Acute kidney failure, unspecified: Secondary | ICD-10-CM | POA: Diagnosis not present

## 2015-11-28 DIAGNOSIS — E11649 Type 2 diabetes mellitus with hypoglycemia without coma: Secondary | ICD-10-CM | POA: Diagnosis present

## 2015-11-28 DIAGNOSIS — M79672 Pain in left foot: Secondary | ICD-10-CM

## 2015-11-28 DIAGNOSIS — R296 Repeated falls: Secondary | ICD-10-CM | POA: Diagnosis present

## 2015-11-28 DIAGNOSIS — Z79899 Other long term (current) drug therapy: Secondary | ICD-10-CM

## 2015-11-28 DIAGNOSIS — I129 Hypertensive chronic kidney disease with stage 1 through stage 4 chronic kidney disease, or unspecified chronic kidney disease: Secondary | ICD-10-CM | POA: Diagnosis present

## 2015-11-28 DIAGNOSIS — E1122 Type 2 diabetes mellitus with diabetic chronic kidney disease: Secondary | ICD-10-CM | POA: Diagnosis present

## 2015-11-28 DIAGNOSIS — E669 Obesity, unspecified: Secondary | ICD-10-CM | POA: Diagnosis present

## 2015-11-28 DIAGNOSIS — Z7984 Long term (current) use of oral hypoglycemic drugs: Secondary | ICD-10-CM

## 2015-11-28 DIAGNOSIS — E1165 Type 2 diabetes mellitus with hyperglycemia: Secondary | ICD-10-CM | POA: Diagnosis present

## 2015-11-28 DIAGNOSIS — N189 Chronic kidney disease, unspecified: Secondary | ICD-10-CM | POA: Diagnosis present

## 2015-11-28 DIAGNOSIS — R7989 Other specified abnormal findings of blood chemistry: Secondary | ICD-10-CM

## 2015-11-28 DIAGNOSIS — R778 Other specified abnormalities of plasma proteins: Secondary | ICD-10-CM

## 2015-11-28 DIAGNOSIS — R531 Weakness: Secondary | ICD-10-CM | POA: Diagnosis not present

## 2015-11-28 DIAGNOSIS — W19XXXA Unspecified fall, initial encounter: Secondary | ICD-10-CM

## 2015-11-28 DIAGNOSIS — I248 Other forms of acute ischemic heart disease: Secondary | ICD-10-CM | POA: Diagnosis present

## 2015-11-28 DIAGNOSIS — Z9889 Other specified postprocedural states: Secondary | ICD-10-CM

## 2015-11-28 HISTORY — DX: Endocarditis, valve unspecified: I38

## 2015-11-28 HISTORY — DX: Hyperlipidemia, unspecified: E78.5

## 2015-11-28 LAB — CBC WITH DIFFERENTIAL/PLATELET
BASOS ABS: 0 10*3/uL (ref 0–0.1)
BASOS PCT: 0 %
EOS ABS: 0.1 10*3/uL (ref 0–0.7)
EOS PCT: 1 %
HCT: 40.5 % (ref 40.0–52.0)
Hemoglobin: 14.2 g/dL (ref 13.0–18.0)
LYMPHS PCT: 8 %
Lymphs Abs: 0.8 10*3/uL — ABNORMAL LOW (ref 1.0–3.6)
MCH: 30.4 pg (ref 26.0–34.0)
MCHC: 35 g/dL (ref 32.0–36.0)
MCV: 87.1 fL (ref 80.0–100.0)
MONO ABS: 0.6 10*3/uL (ref 0.2–1.0)
Monocytes Relative: 5 %
Neutro Abs: 8.8 10*3/uL — ABNORMAL HIGH (ref 1.4–6.5)
Neutrophils Relative %: 86 %
PLATELETS: 203 10*3/uL (ref 150–440)
RBC: 4.65 MIL/uL (ref 4.40–5.90)
RDW: 14.8 % — AB (ref 11.5–14.5)
WBC: 10.3 10*3/uL (ref 3.8–10.6)

## 2015-11-28 LAB — GLUCOSE, CAPILLARY: GLUCOSE-CAPILLARY: 60 mg/dL — AB (ref 65–99)

## 2015-11-28 LAB — PROTIME-INR
INR: 1.02
PROTHROMBIN TIME: 13.6 s (ref 11.4–15.0)

## 2015-11-28 NOTE — ED Notes (Signed)
Patient presents to Emergency Department via EMS with complaints of multiple falls today since 8am, pt reports fall s are mechanical in nature and NOT due to dizziness or "feelings of low blood sugar".  Pt reprots hx of DM, HTN and endocarditis  EMS found pt at 40 CBG gave 0.5 amp D50 and got 62 CBG just prior to arrival.

## 2015-11-29 ENCOUNTER — Encounter: Payer: Self-pay | Admitting: Internal Medicine

## 2015-11-29 ENCOUNTER — Inpatient Hospital Stay: Payer: BLUE CROSS/BLUE SHIELD

## 2015-11-29 DIAGNOSIS — N179 Acute kidney failure, unspecified: Secondary | ICD-10-CM | POA: Diagnosis present

## 2015-11-29 DIAGNOSIS — Z9889 Other specified postprocedural states: Secondary | ICD-10-CM | POA: Diagnosis not present

## 2015-11-29 DIAGNOSIS — E1122 Type 2 diabetes mellitus with diabetic chronic kidney disease: Secondary | ICD-10-CM | POA: Diagnosis present

## 2015-11-29 DIAGNOSIS — E1165 Type 2 diabetes mellitus with hyperglycemia: Secondary | ICD-10-CM | POA: Diagnosis present

## 2015-11-29 DIAGNOSIS — Z7984 Long term (current) use of oral hypoglycemic drugs: Secondary | ICD-10-CM | POA: Diagnosis not present

## 2015-11-29 DIAGNOSIS — R296 Repeated falls: Secondary | ICD-10-CM | POA: Diagnosis present

## 2015-11-29 DIAGNOSIS — Z888 Allergy status to other drugs, medicaments and biological substances status: Secondary | ICD-10-CM | POA: Diagnosis not present

## 2015-11-29 DIAGNOSIS — Z8042 Family history of malignant neoplasm of prostate: Secondary | ICD-10-CM | POA: Diagnosis not present

## 2015-11-29 DIAGNOSIS — Z79899 Other long term (current) drug therapy: Secondary | ICD-10-CM | POA: Diagnosis not present

## 2015-11-29 DIAGNOSIS — E669 Obesity, unspecified: Secondary | ICD-10-CM | POA: Diagnosis present

## 2015-11-29 DIAGNOSIS — R531 Weakness: Secondary | ICD-10-CM | POA: Diagnosis present

## 2015-11-29 DIAGNOSIS — E11649 Type 2 diabetes mellitus with hypoglycemia without coma: Secondary | ICD-10-CM | POA: Diagnosis present

## 2015-11-29 DIAGNOSIS — I129 Hypertensive chronic kidney disease with stage 1 through stage 4 chronic kidney disease, or unspecified chronic kidney disease: Secondary | ICD-10-CM | POA: Diagnosis present

## 2015-11-29 DIAGNOSIS — N189 Chronic kidney disease, unspecified: Secondary | ICD-10-CM | POA: Diagnosis present

## 2015-11-29 DIAGNOSIS — I248 Other forms of acute ischemic heart disease: Secondary | ICD-10-CM | POA: Diagnosis present

## 2015-11-29 HISTORY — DX: Acute kidney failure, unspecified: N17.9

## 2015-11-29 LAB — TSH: TSH: 1.102 u[IU]/mL (ref 0.350–4.500)

## 2015-11-29 LAB — GLUCOSE, CAPILLARY
Glucose-Capillary: 116 mg/dL — ABNORMAL HIGH (ref 65–99)
Glucose-Capillary: 137 mg/dL — ABNORMAL HIGH (ref 65–99)
Glucose-Capillary: 139 mg/dL — ABNORMAL HIGH (ref 65–99)
Glucose-Capillary: 161 mg/dL — ABNORMAL HIGH (ref 65–99)
Glucose-Capillary: 21 mg/dL — CL (ref 65–99)
Glucose-Capillary: 23 mg/dL — CL (ref 65–99)
Glucose-Capillary: 27 mg/dL — CL (ref 65–99)
Glucose-Capillary: 33 mg/dL — CL (ref 65–99)
Glucose-Capillary: 35 mg/dL — CL (ref 65–99)
Glucose-Capillary: 37 mg/dL — CL (ref 65–99)
Glucose-Capillary: 58 mg/dL — ABNORMAL LOW (ref 65–99)
Glucose-Capillary: 65 mg/dL (ref 65–99)
Glucose-Capillary: 75 mg/dL (ref 65–99)
Glucose-Capillary: 81 mg/dL (ref 65–99)
Glucose-Capillary: 82 mg/dL (ref 65–99)
Glucose-Capillary: 83 mg/dL (ref 65–99)

## 2015-11-29 LAB — COMPREHENSIVE METABOLIC PANEL
ALK PHOS: 85 U/L (ref 38–126)
ALT: 19 U/L (ref 17–63)
AST: 31 U/L (ref 15–41)
Albumin: 3.7 g/dL (ref 3.5–5.0)
Anion gap: 5 (ref 5–15)
BUN: 58 mg/dL — AB (ref 6–20)
CHLORIDE: 116 mmol/L — AB (ref 101–111)
CO2: 20 mmol/L — AB (ref 22–32)
CREATININE: 2.52 mg/dL — AB (ref 0.61–1.24)
Calcium: 8.3 mg/dL — ABNORMAL LOW (ref 8.9–10.3)
GFR calc Af Amer: 30 mL/min — ABNORMAL LOW (ref >60)
GFR calc non Af Amer: 26 mL/min — ABNORMAL LOW (ref >60)
GLUCOSE: 108 mg/dL — AB (ref 65–99)
Potassium: 4.7 mmol/L (ref 3.5–5.1)
Sodium: 141 mmol/L (ref 135–145)
Total Bilirubin: 0.6 mg/dL (ref 0.3–1.2)
Total Protein: 6.9 g/dL (ref 6.5–8.1)

## 2015-11-29 LAB — TROPONIN I
Troponin I: 0.05 ng/mL (ref <0.03)
Troponin I: 0.06 ng/mL (ref <0.03)
Troponin I: 0.08 ng/mL (ref <0.03)

## 2015-11-29 LAB — HEMOGLOBIN A1C: Hgb A1c MFr Bld: 6.2 % — ABNORMAL HIGH (ref 4.0–6.0)

## 2015-11-29 MED ORDER — DEXTROSE 50 % IV SOLN
INTRAVENOUS | Status: AC
  Administered 2015-11-29: 50 mL
  Filled 2015-11-29: qty 50

## 2015-11-29 MED ORDER — ASPIRIN EC 81 MG PO TBEC
81.0000 mg | DELAYED_RELEASE_TABLET | Freq: Every day | ORAL | Status: DC
  Administered 2015-11-29 – 2015-12-02 (×4): 81 mg via ORAL
  Filled 2015-11-29 (×4): qty 1

## 2015-11-29 MED ORDER — ONDANSETRON HCL 4 MG PO TABS: 4.0000 mg | ORAL_TABLET | Freq: Four times a day (QID) | ORAL | Status: DC | PRN

## 2015-11-29 MED ORDER — DEXTROSE 50 % IV SOLN
50.0000 mL | Freq: Once | INTRAVENOUS | Status: AC
Start: 2015-11-29 — End: 2015-11-29
  Administered 2015-11-29: 50 mL via INTRAVENOUS

## 2015-11-29 MED ORDER — AMLODIPINE BESYLATE 10 MG PO TABS
10.0000 mg | ORAL_TABLET | Freq: Every day | ORAL | Status: DC
  Administered 2015-11-29 – 2015-12-02 (×4): 10 mg via ORAL
  Filled 2015-11-29 (×4): qty 1

## 2015-11-29 MED ORDER — LIRAGLUTIDE 18 MG/3ML ~~LOC~~ SOPN: 1.8000 mg | PEN_INJECTOR | Freq: Every day | SUBCUTANEOUS | Status: DC

## 2015-11-29 MED ORDER — HYDRALAZINE HCL 50 MG PO TABS
100.0000 mg | ORAL_TABLET | Freq: Two times a day (BID) | ORAL | Status: DC
Start: 2015-11-29 — End: 2015-12-02
  Administered 2015-11-29 – 2015-12-02 (×7): 100 mg via ORAL
  Filled 2015-11-29 (×7): qty 2

## 2015-11-29 MED ORDER — FUROSEMIDE 40 MG PO TABS
20.0000 mg | ORAL_TABLET | Freq: Every day | ORAL | Status: DC
  Administered 2015-11-29: 20 mg via ORAL
  Filled 2015-11-29: qty 1

## 2015-11-29 MED ORDER — DEXTROSE 5 % IV SOLN
INTRAVENOUS | Status: DC
  Administered 2015-11-29: 08:00:00 via INTRAVENOUS

## 2015-11-29 MED ORDER — ACETAMINOPHEN 325 MG PO TABS: 650.0000 mg | ORAL_TABLET | Freq: Four times a day (QID) | ORAL | Status: DC | PRN

## 2015-11-29 MED ORDER — HEPARIN SODIUM (PORCINE) 5000 UNIT/ML IJ SOLN
5000.0000 [IU] | Freq: Three times a day (TID) | INTRAMUSCULAR | Status: DC
  Administered 2015-11-29 – 2015-12-02 (×11): 5000 [IU] via SUBCUTANEOUS
  Filled 2015-11-29 (×11): qty 1

## 2015-11-29 MED ORDER — ONDANSETRON HCL 4 MG/2ML IJ SOLN: 4.0000 mg | Freq: Four times a day (QID) | INTRAMUSCULAR | Status: DC | PRN

## 2015-11-29 MED ORDER — HYDROCODONE-ACETAMINOPHEN 5-325 MG PO TABS: 1.0000 | ORAL_TABLET | ORAL | Status: DC | PRN

## 2015-11-29 MED ORDER — ASPIRIN 81 MG PO CHEW
CHEWABLE_TABLET | ORAL | Status: AC
  Administered 2015-11-29: 324 mg via ORAL
  Filled 2015-11-29: qty 4

## 2015-11-29 MED ORDER — ASPIRIN 81 MG PO CHEW
324.0000 mg | CHEWABLE_TABLET | Freq: Once | ORAL | Status: AC
  Administered 2015-11-29: 324 mg via ORAL

## 2015-11-29 MED ORDER — INSULIN ASPART 100 UNIT/ML ~~LOC~~ SOLN
0.0000 [IU] | Freq: Every day | SUBCUTANEOUS | Status: DC
Start: 2015-11-29 — End: 2015-12-02
  Administered 2015-11-30: 2 [IU] via SUBCUTANEOUS
  Filled 2015-11-29: qty 2

## 2015-11-29 MED ORDER — DEXTROSE 10 % IV SOLN
INTRAVENOUS | Status: DC
  Administered 2015-11-29 (×2): via INTRAVENOUS

## 2015-11-29 MED ORDER — DOCUSATE SODIUM 100 MG PO CAPS
100.0000 mg | ORAL_CAPSULE | Freq: Two times a day (BID) | ORAL | Status: DC
  Administered 2015-11-29 – 2015-12-01 (×7): 100 mg via ORAL
  Filled 2015-11-29 (×8): qty 1

## 2015-11-29 MED ORDER — LORATADINE 10 MG PO TABS
10.0000 mg | ORAL_TABLET | Freq: Every day | ORAL | Status: DC
  Administered 2015-11-29 – 2015-12-02 (×4): 10 mg via ORAL
  Filled 2015-11-29 (×4): qty 1

## 2015-11-29 MED ORDER — ACETAMINOPHEN 650 MG RE SUPP: 650.0000 mg | Freq: Four times a day (QID) | RECTAL | Status: DC | PRN

## 2015-11-29 MED ORDER — SODIUM BICARBONATE 650 MG PO TABS
650.0000 mg | ORAL_TABLET | Freq: Every day | ORAL | Status: DC
  Administered 2015-11-29 – 2015-12-02 (×4): 650 mg via ORAL
  Filled 2015-11-29 (×4): qty 1

## 2015-11-29 MED ORDER — DEXTROSE 50 % IV SOLN
50.0000 mL | INTRAVENOUS | Status: AC
  Administered 2015-11-29: 50 mL via INTRAVENOUS
  Filled 2015-11-29 (×2): qty 50

## 2015-11-29 MED ORDER — SODIUM CHLORIDE 0.9 % IV SOLN
INTRAVENOUS | Status: DC
  Administered 2015-11-29: 03:00:00 via INTRAVENOUS

## 2015-11-29 MED ORDER — INSULIN ASPART 100 UNIT/ML ~~LOC~~ SOLN
0.0000 [IU] | Freq: Three times a day (TID) | SUBCUTANEOUS | Status: DC
  Administered 2015-11-29 – 2015-11-30 (×4): 2 [IU] via SUBCUTANEOUS
  Administered 2015-12-01: 3 [IU] via SUBCUTANEOUS
  Administered 2015-12-01: 2 [IU] via SUBCUTANEOUS
  Administered 2015-12-01: 3 [IU] via SUBCUTANEOUS
  Administered 2015-12-02: 2 [IU] via SUBCUTANEOUS
  Administered 2015-12-02: 5 [IU] via SUBCUTANEOUS
  Filled 2015-11-29 (×2): qty 2
  Filled 2015-11-29: qty 3
  Filled 2015-11-29 (×2): qty 2
  Filled 2015-11-29: qty 5
  Filled 2015-11-29: qty 2
  Filled 2015-11-29: qty 5
  Filled 2015-11-29: qty 2

## 2015-11-29 MED ORDER — ENALAPRIL MALEATE 5 MG PO TABS
20.0000 mg | ORAL_TABLET | Freq: Two times a day (BID) | ORAL | Status: DC
  Administered 2015-11-29 – 2015-12-02 (×8): 20 mg via ORAL
  Filled 2015-11-29 (×8): qty 4

## 2015-11-29 MED ORDER — DEXTROSE 50 % IV SOLN
INTRAVENOUS | Status: AC
  Filled 2015-11-29: qty 50

## 2015-11-29 NOTE — ED Notes (Signed)
CRITICAL LAB: TROPONIN is 0.08, [facility], Dr. Derrill KayGoodman notified, orders recieved

## 2015-11-29 NOTE — Progress Notes (Signed)
[employer] [facility] [place] [facility]   PATIENT NAME: Ruben Ward    MR#:  [id number]  DATE OF BIRTH:  Oct 14, 1955  SUBJECTIVE:   Patient here with weakness and has significant hypoglycemia.   REVIEW OF SYSTEMS:    Review of Systems  Constitutional: Negative for fever, chills and malaise/fatigue.  HENT: Negative for ear discharge, ear pain, hearing loss, nosebleeds and sore throat.   Eyes: Negative for blurred vision and pain.  Respiratory: Negative for cough, hemoptysis, shortness of breath and wheezing.   Cardiovascular: Negative for chest pain, palpitations and leg swelling.  Gastrointestinal: Negative for nausea, vomiting, abdominal pain, diarrhea and blood in stool.  Genitourinary: Negative for dysuria.  Musculoskeletal: Negative for back pain.  Neurological: Negative for dizziness, tremors, speech change, focal weakness, seizures and headaches.  Endo/Heme/Allergies: Does not bruise/bleed easily.  Psychiatric/Behavioral: Negative for depression, suicidal ideas and hallucinations.    Tolerating Diet:yes      DRUG ALLERGIES:   Allergies  Allergen Reactions  . Metformin And Related Other (See Comments)    Renal problems Doctor Solum said that kidney function was ok to take metformin currently    VITALS:  Blood pressure 141/51, pulse 63, temperature 98.1 F (36.7 C), temperature source Oral, resp. rate 20, height  (1.676 m), weight 133.766 kg (294 lb 14.4 oz), SpO2 100 %.  PHYSICAL EXAMINATION:   Physical Exam  Constitutional: He is oriented to person, place, and time and well-developed, well-nourished, and in no distress. No distress.  obese  HENT:  Head: Normocephalic.  Eyes: No scleral icterus.  Neck: Normal range of motion. Neck supple. No JVD present. No tracheal deviation present.  Cardiovascular: Normal rate, regular rhythm and normal heart sounds.  Exam reveals no gallop and no friction rub.   No murmur heard. Pulmonary/Chest:  Effort normal and breath sounds normal. No respiratory distress. He has no wheezes. He has no rales. He exhibits no tenderness.  Abdominal: Soft. Bowel sounds are normal. He exhibits no distension and no mass. There is no tenderness. There is no rebound and no guarding.  Musculoskeletal: Normal range of motion. He exhibits no edema.  Neurological: He is alert and oriented to person, place, and time.  Skin: Skin is warm. No rash noted. No erythema.  Psychiatric: Affect and judgment normal.      LABORATORY PANEL:   CBC  Recent Labs Lab 11/28/15 2332  WBC 10.3  HGB 14.2  HCT 40.5  PLT 203   ------------------------------------------------------------------------------------------------------------------  Chemistries   Recent Labs Lab 11/28/15 2332  NA 141  K 4.7  CL 116*  CO2 20*  GLUCOSE 108*  BUN 58*  CREATININE 2.52*  CALCIUM 8.3*  AST 31  ALT 19  ALKPHOS 85  BILITOT 0.6   ------------------------------------------------------------------------------------------------------------------  Cardiac Enzymes  Recent Labs Lab 11/28/15 2332 11/29/15 0612  TROPONINI 0.08* 0.06*   ------------------------------------------------------------------------------------------------------------------  RADIOLOGY:  Dg Foot 2 Views Left  11/29/2015  CLINICAL DATA:  Status post multiple falls last night with left foot pain. Initial encounter. EXAM: LEFT FOOT - 2 VIEW COMPARISON:  Plain films left foot 06/19/2013. CT left foot 04/23/2015. FINDINGS: No acute bony or joint abnormality is identified. Remote healed fractures of the bases of the second and third metatarsals are noted. Prominent ossification in the distal Achilles is consistent with chronic tendinopathy. Degenerative change about the midfoot and first MTP joint is noted. Atherosclerotic calcifications are seen. IMPRESSION: No acute abnormality. Electronically Signed   By: Drusilla Kanner M.D.  On: 11/29/2015 07:59      ASSESSMENT AND PLAN:    60 year old male with a history of essential hypertension and diabetes who presented with weakness and falls.  1. Hypoglycemia: This is likely related to acute kidney injury and taking diabetic medications. Continue D5 and monitor blood sugars every 2 hours. Obviously holding all diabetic outpatient medications.  2. Acute kidney injury: Hold nephrotoxic agents. Continue IV fluids. Creatinine has not improved by a.m. then would evaluate with renal ultrasound.  3. Elevated troponin: Most likely due to poor renal clearance. Trend troponins.   4. Generalized weakness: Suspect this is due to hypoglycemia. Patient also complaining of foot pain however x-ray does not show evidence of fracture.  5. Essential hypertension: Continue Norvasc, hydralazine and hold Lasix.     Management plans discussed with the patient and he is in agreement.  CODE STATUS: full  TOTAL TIME TAKING CARE OF THIS PATIENT: 28 minutes.   9:05-9:29  POSSIBLE D/C 1-2 days, DEPENDING ON CLINICAL CONDITION.   Monterio Bob M.D on 11/29/2015 at 12:12 PM  Between 7am to 6pm - Pager - [phone] After 6pm go to [email or web address] - password [email or web address]  [facility]  Office  [phone]  CC: Primary care physician; Rafael BihariWALKER III, JOHN B, MD  Note: This dictation was prepared with Dragon dictation along with smaller phrase technology. Any transcriptional errors that result from this process are unintentional.

## 2015-11-29 NOTE — H&P (Signed)
Ruben Ward is an 60 y.o. male.   Chief Complaint: Fall HPI: The patient with past medical history of hypertension and diabetes mellitus take 2 presents emergency department after recurrent falls. The patient's mother states that he fell out of bed this morning and again later this evening. The patient has felt acutely weaker today than on previous days this week. In fact he was able to go to the swimming pool for recreation yesterday but today was distinctly weaker. Upon arrival EMS found the patient's blood sugar to be in the 40s. He received D50 in the emergency department which improved his sugar. Now he feels significantly better. However, laboratory evaluation revealed acute kidney injury and elevated troponin. The patient has denied chest pain or shortness of breath. Due to his cardiovascular risk factors and elevated troponin in the emergency department staff called the hospitalist service for further evaluation.  Past Medical History  Diagnosis Date  . Diabetes mellitus without complication ([place])   . Hypertension   . Endocarditis   . Hyperlipemia     Past Surgical History  Procedure Laterality Date  . Shoulder surgery    . Knee arthroscopy Bilateral   . Wrist arthroscopy Right     Family History  Problem Relation Age of Onset  . Prostate cancer Father    Social History:  reports that he has never smoked. He does not have any smokeless tobacco history on file. He reports that he does not drink alcohol. His drug history is not on file.  Allergies:  Allergies  Allergen Reactions  . Metformin And Related Other (See Comments)    Renal problems Doctor Solum said that kidney function was ok to take metformin currently    Medications Prior to Admission  Medication Sig Dispense Refill  . amLODipine (NORVASC) 10 MG tablet Take 10 mg by mouth daily.    . cetirizine (ZYRTEC) 10 MG tablet Take 10 mg by mouth daily.    . enalapril (VASOTEC) 20 MG tablet Take 20 mg by mouth 2  (two) times daily.    . furosemide (LASIX) 20 MG tablet Take 20 mg by mouth daily.    Marland Kitchen glimepiride (AMARYL) 4 MG tablet Take 4 mg by mouth daily.    . hydrALAZINE (APRESOLINE) 100 MG tablet Take 100 mg by mouth 2 (two) times daily.    . Liraglutide (VICTOZA) 18 MG/3ML SOPN Inject 1.8 mg into the skin daily.    . metFORMIN (GLUCOPHAGE-XR) 500 MG 24 hr tablet Take 1,500 mg by mouth every evening.    . sodium bicarbonate 650 MG tablet Take 650 mg by mouth daily.      Results for orders placed or performed during the hospital encounter of 11/28/15 (from the past 48 hour(s))  Glucose, capillary     Status: Abnormal   Collection Time: 11/28/15 11:15 PM  Result Value Ref Range   Glucose-Capillary 60 (L) 65 - 99 mg/dL   Comment 1 Call MD NNP PA CNM   CBC with Differential     Status: Abnormal   Collection Time: 11/28/15 11:32 PM  Result Value Ref Range   WBC 10.3 3.8 - 10.6 K/uL   RBC 4.65 4.40 - 5.90 MIL/uL   Hemoglobin 14.2 13.0 - 18.0 g/dL   HCT 40.5 40.0 - 52.0 %   MCV 87.1 80.0 - 100.0 fL   MCH 30.4 26.0 - 34.0 pg   MCHC 35.0 32.0 - 36.0 g/dL   RDW 14.8 (H) 11.5 - 14.5 %   Platelets  203 150 - 440 K/uL   Neutrophils Relative % 86 %   Neutro Abs 8.8 (H) 1.4 - 6.5 K/uL   Lymphocytes Relative 8 %   Lymphs Abs 0.8 (L) 1.0 - 3.6 K/uL   Monocytes Relative 5 %   Monocytes Absolute 0.6 0.2 - 1.0 K/uL   Eosinophils Relative 1 %   Eosinophils Absolute 0.1 0 - 0.7 K/uL   Basophils Relative 0 %   Basophils Absolute 0.0 0 - 0.1 K/uL  Comprehensive metabolic panel     Status: Abnormal   Collection Time: 11/28/15 11:32 PM  Result Value Ref Range   Sodium 141 135 - 145 mmol/L   Potassium 4.7 3.5 - 5.1 mmol/L   Chloride 116 (H) 101 - 111 mmol/L   CO2 20 (L) 22 - 32 mmol/L   Glucose, Bld 108 (H) 65 - 99 mg/dL   BUN 58 (H) 6 - 20 mg/dL   Creatinine, Ser 2.52 (H) 0.61 - 1.24 mg/dL   Calcium 8.3 (L) 8.9 - 10.3 mg/dL   Total Protein 6.9 6.5 - 8.1 g/dL   Albumin 3.7 3.5 - 5.0 g/dL   AST 31 15  - 41 U/L   ALT 19 17 - 63 U/L   Alkaline Phosphatase 85 38 - 126 U/L   Total Bilirubin 0.6 0.3 - 1.2 mg/dL   GFR calc non Af Amer 26 (L) >60 mL/min   GFR calc Af Amer 30 (L) >60 mL/min    Comment: (NOTE) The eGFR has been calculated using the CKD EPI equation. This calculation has not been validated in all clinical situations. eGFR's persistently <60 mL/min signify possible Chronic Kidney Disease.    Anion gap 5 5 - 15  Protime-INR     Status: None   Collection Time: 11/28/15 11:32 PM  Result Value Ref Range   Prothrombin Time 13.6 11.4 - 15.0 seconds   INR 1.02   Troponin I     Status: Abnormal   Collection Time: 11/28/15 11:32 PM  Result Value Ref Range   Troponin I 0.08 (HH) <0.03 ng/mL    Comment: CRITICAL RESULT CALLED TO, READ BACK BY AND VERIFIED WITH NOEL WEBSTER 11/29/15 AT 0005 BY TLB   Glucose, capillary     Status: None   Collection Time: 11/29/15 12:20 AM  Result Value Ref Range   Glucose-Capillary 75 65 - 99 mg/dL   Comment 1 Notify RN   Glucose, capillary     Status: Abnormal   Collection Time: 11/29/15  4:43 AM  Result Value Ref Range   Glucose-Capillary 27 (LL) 65 - 99 mg/dL  Glucose, capillary     Status: Abnormal   Collection Time: 11/29/15  4:47 AM  Result Value Ref Range   Glucose-Capillary 35 (LL) 65 - 99 mg/dL  Glucose, capillary     Status: Abnormal   Collection Time: 11/29/15  5:09 AM  Result Value Ref Range   Glucose-Capillary 116 (H) 65 - 99 mg/dL   No results found.  Review of Systems  Constitutional: Negative for fever and chills.  HENT: Negative for sore throat and tinnitus.   Eyes: Negative for blurred vision and redness.  Respiratory: Negative for cough and shortness of breath.   Cardiovascular: Negative for chest pain, palpitations, orthopnea and PND.  Gastrointestinal: Negative for nausea, vomiting, abdominal pain and diarrhea.  Genitourinary: Negative for dysuria, urgency and frequency.  Musculoskeletal: Positive for falls.  Negative for myalgias and joint pain.  Skin: Negative for rash.  No lesions  Neurological: Positive for weakness. Negative for speech change and focal weakness.  Endo/Heme/Allergies: Does not bruise/bleed easily.       No temperature intolerance  Psychiatric/Behavioral: Negative for depression and suicidal ideas.    Blood pressure 137/55, pulse 72, temperature 98.1 F (36.7 C), temperature source Oral, resp. rate 20, height 5' 6"  (1.676 m), weight 133.766 kg (294 lb 14.4 oz), SpO2 96 %. Physical Exam  Constitutional: He is oriented to person, place, and time. He appears well-developed and well-nourished. No distress.  HENT:  Head: Normocephalic and atraumatic.  Mouth/Throat: Oropharynx is clear and moist.  Eyes: Conjunctivae and EOM are normal. Pupils are equal, round, and reactive to light. No scleral icterus.  Neck: Normal range of motion. Neck supple. No JVD present. No tracheal deviation present. No thyromegaly present.  Cardiovascular: Normal rate, regular rhythm and normal heart sounds.  Exam reveals no gallop and no friction rub.   No murmur heard. Respiratory: Effort normal and breath sounds normal. No respiratory distress.  GI: Soft. Bowel sounds are normal. He exhibits no distension. There is no tenderness.  Genitourinary:  Deferred  Musculoskeletal: Normal range of motion. He exhibits edema.  Lymphadenopathy:    He has no cervical adenopathy.  Neurological: He is alert and oriented to person, place, and time. No cranial nerve deficit.  Skin: Skin is warm and dry. No rash noted. No erythema.  Psychiatric: He has a normal mood and affect. His behavior is normal. Judgment and thought content normal.     Assessment/Plan This is a 60 year old male admitted for acute kidney injury and elevated troponin. 1. Acute kidney injury: Hydrate gently with intravenous fluid. The patient may have some congestive heart failure. Watch fluid balance. Avoid nephrotoxic agents. 2.  Essential hypertension: Controlled; continue amlodipine, hydralazine and enalapril 3. Elevated troponin: Likely secondary to poor clearance given worsened renal function; continue to follow. Cardiology consultation at the discretion of the primary team 4. Diabetes mellitus type 2: Check hemoglobin A1c; hold metformin. Sliding scale insulin while hospitalized. May continue Victoza 5. DVT prophylaxis: Heparin 6. GI prophylaxis: None The patient is a full code. Time spent on admission orders and patient care approximately 45 minutes  Harrie Foreman, MD 11/29/2015, 7:08 AM

## 2015-11-29 NOTE — Progress Notes (Signed)
Physical Therapy Evaluation Patient Details Name: Ruben Ward MRN: [id number] DOB: 10/29/1955 Today's Date: 11/29/2015   History of Present Illness  Pt admitted for acute kidney injury and elevated troponin. Pt reports he has had many falls recently due to L foot pain.  Clinical Impression  Pt is a pleasant and cooperative 60 y/o male who presents with LE weakness. PMH includes HTN and DM. Per RN, pt was hypoglycemic in AM, glucose is being monitored. Pt reports he has had many falls recently and is experiencing L foot pain (7/10, aching). Pt also reports weakness secondary to L foot pain causing inactivity. Pt's baseline consists of ambulation around home and community with no AD. UE grossly WNL. Pt is able to perform 5 B SLRs but has some difficulty. Pt requires moderate assistance with bed mobility and +2 moderate assistance with transfers and gait. Pt demonstrates good sitting balance but relies heavily on B UE support on RW for standing balance. Pt reports no dizziness upon standing. Pt also required assistance to void with use of urinal, +2 mod assist to maintain balance and urinal placement. Pt reports L foot pain with ambulation but is able to weight-bear. Pt will benefit from skilled PT in order to improve strength, balance, and functional mobility deficits. Pt is appropriate for STR to address impairments.     Follow Up Recommendations SNF    Equipment Recommendations       Recommendations for Other Services       Precautions / Restrictions Precautions Precautions: Fall Restrictions Weight Bearing Restrictions: No      Mobility  Bed Mobility Overal bed mobility: Needs Assistance Bed Mobility: Supine to Sit     Supine to sit: Mod assist     General bed mobility comments: Pt uses B UE to pull himself to edge of bed with assist from PT. Pt required verbal cueing to scoot to EOB and demonstrates good sitting balance.  Transfers Overall transfer level: Needs  assistance Equipment used: Rolling walker (2 wheeled) (Bariatric RW) Transfers: Sit to/from Stand Sit to Stand: Mod assist;+2 physical assistance         General transfer comment: First attempt to stand up without RW, pt required +2 max assist and was leaning on PTs. Pt was placed back in bed. 2nd attempt with RW required +2 mod assist, standing balance improved with UE support.  Pt relied heavily on B UE support on RW for transfer. Pt has no reports of dizziness upon standing.  Ambulation/Gait Ambulation/Gait assistance: Mod assist;+2 physical assistance Ambulation Distance (Feet): 3 Feet (to chair) Assistive device: Rolling walker (2 wheeled) Gait Pattern/deviations: Step-to pattern;Decreased step length - right;Decreased step length - left;Shuffle;Antalgic;Narrow base of support   Gait velocity interpretation: Below normal speed for age/gender General Gait Details: Pt exhibits a slow cadence and requires verbal cueing for proper technique. Pt reports he has pain in L foot but is able to weight-bear. Pt continues to rely heavily on B UE support on RW for balance.  Stairs            Wheelchair Mobility    Modified Rankin (Stroke Patients Only)       Balance Overall balance assessment: Needs assistance Sitting-balance support: Feet supported;No upper extremity supported Sitting balance-Leahy Scale: Normal Sitting balance - Comments: Pt demonstrates safe sitting balance.   Standing balance support: Bilateral upper extremity supported Standing balance-Leahy Scale: Poor Standing balance comment: Pt required +2 moderate assist for standing balance using RW. B UE support on RW  Pertinent Vitals/Pain Pain Assessment: 0-10 Pain Score: 7  Pain Location: L foot Pain Descriptors / Indicators: Aching Pain Intervention(s): Limited activity within patient's tolerance;Monitored during session    Home Living Family/patient expects to be  discharged to:: Private residence Living Arrangements: Parent (Pt's mom is in [facility]) Available Help at Discharge: Family Type of Home: House Home Access: Stairs to enter Entrance Stairs-Rails: None Entrance Stairs-Number of Steps: 1 Home Layout: One level Home Equipment: Environmental consultant - 2 wheels (2 RWs at home) Additional Comments: Pt reports he lives at home with his mom who uses a WC and they both take care of each other. There is one step to get into house with no railing. Pt reports he does not have trouble getting up that step.    Prior Function Level of Independence: Independent         Comments: Pt reports he was independent with all home and community ambulation prior to admission.     Hand Dominance        Extremity/Trunk Assessment   Upper Extremity Assessment: Overall WFL for tasks assessed (5/5 grip strength and elbow flex.)           Lower Extremity Assessment: Generalized weakness (able to perform 5 B SLR )         Communication   Communication: No difficulties  Cognition Arousal/Alertness: Awake/alert Behavior During Therapy: WFL for tasks assessed/performed Overall Cognitive Status: Within Functional Limits for tasks assessed                      General Comments      Exercises Other Exercises Other Exercises: Supine ther-ex: B SLR x 5 reps and hip abd/add x 10 reps. Verbal cueing to perform correct technique for hip abd/add. Seated ther-ex: B marches and LAQs (with minimal resistance) x 10 reps.  Other Exercises: PT assistance to void in standing (with RW) with urinal. Assistance required to maintain balance and to aid in placement of urinal.       Assessment/Plan    PT Assessment Patient needs continued PT services  PT Diagnosis Difficulty walking;Generalized weakness   PT Problem List Decreased strength;Decreased activity tolerance;Decreased balance;Decreased mobility;Decreased knowledge of use of DME;Pain  PT Treatment Interventions DME  instruction;Gait training;Functional mobility training;Therapeutic activities;Therapeutic exercise;Balance training;Patient/family education   PT Goals (Current goals can be found in the Care Plan section) Acute Rehab PT Goals Patient Stated Goal: To be able to walk without pain PT Goal Formulation: With patient Time For Goal Achievement: 12/13/15 Potential to Achieve Goals: Good    Frequency Min 2X/week   Barriers to discharge Decreased caregiver support (Mother in [facility])      Co-evaluation               End of Session Equipment Utilized During Treatment: Gait belt Activity Tolerance: Patient limited by fatigue;Patient limited by pain Patient left: in chair;with call bell/phone within reach;with chair alarm set Nurse Communication: Mobility status         Time: 4098-1191 PT Time Calculation (min) (ACUTE ONLY): 25 min   Charges:   PT Evaluation $PT Eval Moderate Complexity: 1 Procedure PT Treatments $Therapeutic Exercise: 8-22 mins   PT G Codes:        Thereasa Parkin 12/09/15, 2:52 PM  Thereasa Parkin, SPT [phone]

## 2015-11-29 NOTE — Progress Notes (Signed)
Inpatient Diabetes Program Recommendations  AACE/ADA: New Consensus Statement on Inpatient Glycemic Control (2015)  Target Ranges:  Prepandial:   less than 140 mg/dL      Peak postprandial:   less than 180 mg/dL (1-2 hours)      Critically ill patients:  140 - 180 mg/dL   Lab Results  Component Value Date   GLUCAP 58* 11/29/2015    Inpatient Diabetes Program Recommendations:  Met with patient at the bedside.  He is comfortable giving his Victoza via pen and has used insulin (pen) in the past.  If he is discharged home with insulin, please order the insulin pen teaching kit (manage orders ) and review with him.  He currently uses his abdomen for Victoza.  I have instructed him to rotate sites (he does) on his abdomen if insulin is being used.  Patient sees Dr. Gabriel Carina for diabetes management and has a nephrologist for kidney care.   Gentry Fitz, RN, BA, MHA, CDE Diabetes Coordinator Inpatient Diabetes Program  [phone] (Team Pager) [phone] ([place]) 11/29/2015 1:39 PM

## 2015-11-29 NOTE — ED Provider Notes (Signed)
[facility] Emergency Department Provider Note    ____________________________________________  Time seen: ~0055  I have reviewed the triage vital signs and the nursing notes.   HISTORY  Chief Complaint Hypoglycemia and Fall   History limited by: Not Limited   HPI Ruben Ward is a 60 y.o. male who presents to the emergency department today because of concerns for multiple falls today and weakness. The patient is unsure why he was so weak today. He denies any fevers, chest pain or shortness breath. When he denies arrived on scene they did note him to be hypoglycemic with a blood sugar of 40. He was given D50. Patient has a history of bacterial endocarditis. He does follow-up with cardiologist twice a year. He is unsure when his last echocardiogram once.  Past Medical History  Diagnosis Date  . Diabetes mellitus without complication (HCC)   . Hypertension   . Endocarditis   . Hyperlipemia     There are no active problems to display for this patient.   Past Surgical History  Procedure Laterality Date  . Shoulder surgery    . Knee arthroscopy Bilateral   . Wrist arthroscopy Right     Current Outpatient Rx  Name  Route  Sig  Dispense  Refill  . amLODipine (NORVASC) 10 MG tablet   Oral   Take 10 mg by mouth daily.         . cetirizine (ZYRTEC) 10 MG tablet   Oral   Take 10 mg by mouth daily.         . enalapril (VASOTEC) 20 MG tablet   Oral   Take 20 mg by mouth 2 (two) times daily.         . furosemide (LASIX) 20 MG tablet   Oral   Take 20 mg by mouth daily.         Marland Kitchen glimepiride (AMARYL) 4 MG tablet   Oral   Take 4 mg by mouth daily.         . hydrALAZINE (APRESOLINE) 100 MG tablet   Oral   Take 100 mg by mouth 2 (two) times daily.         . Liraglutide (VICTOZA) 18 MG/3ML SOPN   Subcutaneous   Inject 1.8 mg into the skin daily.         . metFORMIN (GLUCOPHAGE-XR) 500 MG 24 hr tablet   Oral   Take 1,500 mg  by mouth every evening.         . sodium bicarbonate 650 MG tablet   Oral   Take 650 mg by mouth daily.           Allergies Metformin and related  History reviewed. No pertinent family history.  Social History Social History  Substance Use Topics  . Smoking status: Never Smoker   . Smokeless tobacco: None  . Alcohol Use: No    Review of Systems  Constitutional: Negative for fever. Cardiovascular: Negative for chest pain. Respiratory: Negative for shortness of breath. Gastrointestinal: Negative for abdominal pain, vomiting and diarrhea. Neurological: Negative for headaches, focal weakness or numbness.   10-point ROS otherwise negative.  ____________________________________________   PHYSICAL EXAM:  VITAL SIGNS: ED Triage Vitals  Enc Vitals Group     BP 11/28/15 2321 132/53 mmHg     Pulse Rate 11/28/15 2321 80     Resp 11/28/15 2321 22     Temp 11/28/15 2321 97.6 F (36.4 C)     Temp Source 11/28/15 2321  Oral     SpO2 11/28/15 2321 97 %     Weight 11/28/15 2321 300 lb (136.079 kg)     Height 11/28/15 2321  (1.676 m)     Head Cir --      Peak Flow --      Pain Score 11/28/15 2326 8   Constitutional: Alert and oriented. Well appearing and in no distress. Eyes: Conjunctivae are normal. PERRL. Normal extraocular movements. ENT   Head: Normocephalic and atraumatic.   Nose: No congestion/rhinnorhea.   Mouth/Throat: Mucous membranes are moist.   Neck: No stridor. Hematological/Lymphatic/Immunilogical: No cervical lymphadenopathy. Cardiovascular: Normal rate, regular rhythm.  No murmurs, rubs, or gallops. Respiratory: Normal respiratory effort without tachypnea nor retractions. Breath sounds are clear and equal bilaterally. No wheezes/rales/rhonchi. Gastrointestinal: Soft and nontender. No distention.  Genitourinary: Deferred Musculoskeletal: Normal range of motion in all extremities. No joint effusions.  Mild lower extremity  edema. Neurologic:  Normal speech and language. No gross focal neurologic deficits are appreciated.  Skin:  Skin is warm, dry and intact. No rash noted. Psychiatric: Mood and affect are normal. Speech and behavior are normal. Patient exhibits appropriate insight and judgment.  ____________________________________________    LABS (pertinent positives/negatives)  Labs Reviewed  GLUCOSE, CAPILLARY - Abnormal; Notable for the following:    Glucose-Capillary 60 (*)    All other components within normal limits  CBC WITH DIFFERENTIAL/PLATELET - Abnormal; Notable for the following:    RDW 14.8 (*)    Neutro Abs 8.8 (*)    Lymphs Abs 0.8 (*)    All other components within normal limits  COMPREHENSIVE METABOLIC PANEL - Abnormal; Notable for the following:    Chloride 116 (*)    CO2 20 (*)    Glucose, Bld 108 (*)    BUN 58 (*)    Creatinine, Ser 2.52 (*)    Calcium 8.3 (*)    GFR calc non Af Amer 26 (*)    GFR calc Af Amer 30 (*)    All other components within normal limits  TROPONIN I - Abnormal; Notable for the following:    Troponin I 0.08 (*)    All other components within normal limits  PROTIME-INR  GLUCOSE, CAPILLARY     ____________________________________________   EKG  I, Phineas Semen, attending physician, personally viewed and interpreted this EKG  EKG Time: 2312 Rate: 83 Rhythm: normal sinus rhythm Axis: normal Intervals: qtc 456 QRS: incomplete RBBB ST changes: no st elevation Impression: abnormal ekg   ____________________________________________    RADIOLOGY  None  ____________________________________________   PROCEDURES  Procedure(s) performed: None  Critical Care performed: No  ____________________________________________   INITIAL IMPRESSION / ASSESSMENT AND PLAN / ED COURSE  Pertinent labs & imaging results that were available during my care of the patient were reviewed by me and considered in my medical decision making (see chart  for details).  Patient presented to the emergency department today after multiple falls and weakness. Initially patient was quite hypoglycemic for EMS on the this has resolved with D50 and letting the patient eat or drink. Of note the patient's troponin was elevated to 0.08. EKG without any concerning acute findings. There are no previous troponins to compare to. He does have some mild lower extremity edema. I would be somewhat concerned that the elevated troponin could be secondary to ACS versus CHF. Will plan on admitting to the hospital service. Patient was given aspirin.  ____________________________________________   FINAL CLINICAL IMPRESSION(S) / ED DIAGNOSES  Final diagnoses:  Elevated troponin  Falls, initial encounter  Weakness     Note: This dictation was prepared with Dragon dictation. Any transcriptional errors that result from this process are unintentional    Phineas SemenGraydon Rebekah Sprinkle, MD 11/29/15 0127

## 2015-11-29 NOTE — Clinical Social Work Note (Signed)
Clinical Social Work Assessment  Patient Details  Name: Ruben Ward MRN: [id number] Date of Birth: 1956/05/13  Date of referral:  11/29/15               Reason for consult:  Facility Placement                Permission sought to share information with:  Facility Medical sales representativeContact Representative, Family Supports Permission granted to share information::  Yes, Verbal Permission Granted  Name::        Agency::     Relationship::     Contact Information:     Housing/Transportation Living arrangements for the past 2 months:  Single Family Home Source of Information:  Patient, Parent Patient Interpreter Needed:  None Criminal Activity/Legal Involvement Pertinent to Current Situation/Hospitalization:  No - Comment as needed Significant Relationships:  Parents Lives with:  Parents Do you feel safe going back to the place where you live?  Yes Need for family participation in patient care:  Yes (Comment)  Care giving concerns:  Patient resides with his elderly mother and is not able to ambulate well at this time.   Social Worker assessment / plan:  PT assessed patient today and are recommending STR. CSW spoke with patient and he is in agreement with rehab placement and wishes for me to call his mother to update her. CSW contacted patient's mother and she stated as long as it was short term, she would agree with it. She said that [facility] had [facility] and then [facility] but did not like it and decided to move back home. CSW explained that patient's insurance would have to authorize. Bedsearch initiated.  Employment status:    Insurance informationAdvertising account executive:  Managed Medicare PT Recommendations:  Skilled Nursing Facility Information / Referral to community resources:  Skilled Nursing Facility  Patient/Family's Response to care:  Patient and mother expressed appreciation for CSW assistance.  Patient/Family's Understanding of and Emotional Response to Diagnosis,  Current Treatment, and Prognosis:  Patient agreeable to rehab.  Emotional Assessment Appearance:  Appears stated age Attitude/Demeanor/Rapport:   (pleasant and cooperative) Affect (typically observed):  Accepting, Adaptable, Calm Orientation:  Oriented to Self, Oriented to Place, Oriented to  Time, Oriented to Situation Alcohol / Substance use:  Not Applicable Psych involvement (Current and /or in the community):  No (Comment)  Discharge Needs  Concerns to be addressed:  Care Coordination Readmission within the last 30 days:  No Current discharge risk:  None Barriers to Discharge:  No Barriers Identified   York SpanielMonica Gurnoor Sloop, LCSW 11/29/2015, 3:18 PM

## 2015-11-29 NOTE — Progress Notes (Signed)
Notified MD about patient low blood sugar throughout shift. New orders received, see Emar. Pt resting in bed. Continue to assess.

## 2015-11-29 NOTE — Progress Notes (Signed)
Inpatient Diabetes Program Recommendations  AACE/ADA: New Consensus Statement on Inpatient Glycemic Control (2015)  Target Ranges:  Prepandial:   less than 140 mg/dL      Peak postprandial:   less than 180 mg/dL (1-2 hours)      Critically ill patients:  140 - 180 mg/dL   Lab Results  Component Value Date   GLUCAP 81 11/29/2015    Review of Glycemic Control  Results for Ruben CurlingFAMBROUGH, Riddick W (MRN [id number]) as of 11/29/2015 09:22  Ref. Range 11/29/2015 04:47 11/29/2015 05:09 11/29/2015 07:27 11/29/2015 07:29 11/29/2015 08:26  Glucose-Capillary Latest Ref Range: 65-99 mg/dL 35 (LL) 409116 (H) 21 (LL) 23 (LL) 81    Diabetes history: Type 2 - patient of Dr. Tedd SiasSolum Outpatient Diabetes medications: Amaryl 4mg  /day, Victoza 1.85mg  SQ q day, Glucophage 1500mg  qam Current orders for Inpatient glycemic control: Novolog 0-9 units tid, Novolog 0-5 units qhs  Inpatient Diabetes Program Recommendations:  Agree with current orders for glucose management- based on renal function, I would not recommend returning to oral diabetes medications.   If you anticipate that this patient would go home on insulin- please notify staff so they can begin insulin teaching.   Susette RacerJulie Rannie Craney, RN, BA, MHA, CDE Diabetes Coordinator Inpatient Diabetes Program  [phone] (Team Pager) [phone] [facility] Office) 11/29/2015 9:32 AM

## 2015-11-29 NOTE — NC FL2 (Addendum)
[place] MEDICAID FL2 LEVEL OF CARE SCREENING TOOL     IDENTIFICATION  Patient Name: Ruben Ward Birthdate: 01-23-1956 Sex: male Admission Date (Current Location): 11/28/2015  [place] and [place] Number:  Chiropodist and Address:  [facility], [address] [place], [place] [address]      Provider Number: [id number]  Attending Physician Name and Address:  Adrian Saran, MD  Relative Name and Phone Number:       Current Level of Care: Hospital Recommended Level of Care: Skilled Nursing Facility Prior Approval Number:    Date Approved/Denied:   PASRR Number: [id number]  Discharge Plan: SNF    Current Diagnoses: Patient Active Problem List   Diagnosis Date Noted  . AKI (acute kidney injury) (HCC) 11/29/2015    Orientation RESPIRATION BLADDER Height & Weight     Self, Time, Situation, Place  Normal Incontinent Weight: 294 lb 14.4 oz (133.766 kg) Height:   (167.6 cm)  BEHAVIORAL SYMPTOMS/MOOD NEUROLOGICAL BOWEL NUTRITION STATUS   (none)  (none) Continent Diet (carb modified)  AMBULATORY STATUS COMMUNICATION OF NEEDS Skin   Extensive Assist Verbally Normal                       Personal Care Assistance Level of Assistance  Bathing, Dressing, Feeding Bathing Assistance: Independent Feeding assistance: Independent Dressing Assistance: Independent     Functional Limitations Info   (no issues)          SPECIAL CARE FACTORS FREQUENCY  PT (By licensed PT)   OT (By licensed OT)    PT (5)  OT (5)                Contractures Contractures Info: Not present    Additional Factors Info  Code Status, Allergies, Insulin Sliding Scale Code Status Info: full Allergies Info: metformin           Current Medications (11/29/2015):  This is the current hospital active medication list Current Facility-Administered Medications  Medication Dose Route Frequency Provider Last Rate Last Dose  . acetaminophen  (TYLENOL) tablet 650 mg  650 mg Oral Q6H PRN Arnaldo Natal, MD       Or  . acetaminophen (TYLENOL) suppository 650 mg  650 mg Rectal Q6H PRN Arnaldo Natal, MD      . amLODipine (NORVASC) tablet 10 mg  10 mg Oral Daily Arnaldo Natal, MD   10 mg at 11/29/15 1214  . aspirin EC tablet 81 mg  81 mg Oral Daily Arnaldo Natal, MD   81 mg at 11/29/15 1214  . dextrose 10 % infusion   Intravenous Continuous Adrian Saran, MD 110 mL/hr at 11/29/15 1249    . docusate sodium (COLACE) capsule 100 mg  100 mg Oral BID Arnaldo Natal, MD   100 mg at 11/29/15 1213  . enalapril (VASOTEC) tablet 20 mg  20 mg Oral BID Arnaldo Natal, MD   20 mg at 11/29/15 1213  . heparin injection 5,000 Units  5,000 Units Subcutaneous Q8H Arnaldo Natal, MD   5,000 Units at 11/29/15 5621  . hydrALAZINE (APRESOLINE) tablet 100 mg  100 mg Oral BID Arnaldo Natal, MD   100 mg at 11/29/15 1214  . HYDROcodone-acetaminophen (NORCO/VICODIN) 5-325 MG per tablet 1-2 tablet  1-2 tablet Oral Q4H PRN Arnaldo Natal, MD      . insulin aspart (novoLOG) injection 0-5 Units  0-5 Units Subcutaneous QHS Arnaldo Natal, MD      .  insulin aspart (novoLOG) injection 0-9 Units  0-9 Units Subcutaneous TID WC Arnaldo NatalMichael S Diamond, MD   0 Units at 11/29/15 0800  . loratadine (CLARITIN) tablet 10 mg  10 mg Oral Daily Arnaldo NatalMichael S Diamond, MD   10 mg at 11/29/15 1213  . ondansetron (ZOFRAN) tablet 4 mg  4 mg Oral Q6H PRN Arnaldo NatalMichael S Diamond, MD       Or  . ondansetron [facility](ZOFRAN) injection 4 mg  4 mg Intravenous Q6H PRN Arnaldo NatalMichael S Diamond, MD      . sodium bicarbonate tablet 650 mg  650 mg Oral Daily Arnaldo NatalMichael S Diamond, MD   650 mg at 11/29/15 1212     Discharge Medications: Please see discharge summary for a list of discharge medications.  Relevant Imaging Results:  Relevant Lab Results:   Additional Information SS: [id number]  York SpanielMonica Marra, LCSW

## 2015-11-30 LAB — BASIC METABOLIC PANEL
Anion gap: 6 (ref 5–15)
BUN: 42 mg/dL — AB (ref 6–20)
CO2: 21 mmol/L — ABNORMAL LOW (ref 22–32)
CREATININE: 1.96 mg/dL — AB (ref 0.61–1.24)
Calcium: 8.3 mg/dL — ABNORMAL LOW (ref 8.9–10.3)
Chloride: 108 mmol/L (ref 101–111)
GFR calc Af Amer: 41 mL/min — ABNORMAL LOW (ref >60)
GFR, EST NON AFRICAN AMERICAN: 35 mL/min — AB (ref >60)
GLUCOSE: 188 mg/dL — AB (ref 65–99)
Potassium: 4.8 mmol/L (ref 3.5–5.1)
SODIUM: 135 mmol/L (ref 135–145)

## 2015-11-30 LAB — GLUCOSE, CAPILLARY
Glucose-Capillary: 196 mg/dL — ABNORMAL HIGH (ref 65–99)
Glucose-Capillary: 175 mg/dL — ABNORMAL HIGH (ref 65–99)
Glucose-Capillary: 184 mg/dL — ABNORMAL HIGH (ref 65–99)
Glucose-Capillary: 211 mg/dL — ABNORMAL HIGH (ref 65–99)

## 2015-11-30 NOTE — Plan of Care (Signed)
Problem: Acute Rehab OT Goals (only OT should resolve) Goal: Pt. Will Perform Lower Body Dressing Outcome: Progressing Pt able to perform LB dressing with set up, with Min Assist and verbal cues for technique/safey using A/E in sitting EOB and in standing with RW. Would benefit from additional training to ensure carryover of newly learned techniques and safety prior to meeting goal.

## 2015-11-30 NOTE — Progress Notes (Signed)
Clinical Child psychotherapistocial Worker (CSW) presented bed offers to patient. Patient reported that he would review offers and get back with CSW. CSW also called patient's mother Claris CheMargaret and presented bed offers. Per mother she does not want [facility], so that leaves [facility] or [facility]. CSW explained that patient's insurance BCBS will have to approve SNF stay and emphasized the sooner patient can pick a bed the better. CSW will continue to follow and assist as needed.   Baker Hughes IncorporatedBailey Samer Dutton, LCSW [phone]

## 2015-11-30 NOTE — Progress Notes (Signed)
Occupational Therapy Treatment Patient Details Name: Mindi CurlingDonald W Echevarria MRN: [id number] DOB: 01-23-1956 Today's Date: 11/30/2015    History of present illness Pt admitted for elevated troponin and acute kidney injury and several falls at home   OT comments  60yo male admitted for elevated troponin and acute kidney injury with several reported falls at home prior to admission. Pt reports "feeling a little better" today, presents with continued 7/10 L foot pain which did not increase with activity or in standing. Pt performed bed mobility with close supervision, an improvement from previous session. Once EOB, Pt educated in use of A/E for LB dressing including sock aid and reacher. After visual demonstration and verbal instructions, pt able to return demonstrate good technique using A/E to doff/don socks while sitting EOB and don shorts sitting EOB to initiate over feet and complete donning over hips in standing with 1 UE supported on RW at all times. No loss of balance during functional ADL activity. Overall, pt is progressing towards goals with marked improvement in bed mobility, sit<>stand transfers, and LB dressing. Continues to benefit from skilled OT services in order to maximize safety and independence for functional ADL and mobility in order to return to PLOF.   Follow Up Recommendations  SNF    Equipment Recommendations  Tub/shower bench    Recommendations for Other Services      Precautions / Restrictions Precautions Precautions: Fall Restrictions Weight Bearing Restrictions: No       Mobility Bed Mobility Overal bed mobility: Needs Assistance Bed Mobility: Supine to Sit;Sit to Supine     Supine to sit: Supervision;HOB elevated     General bed mobility comments: Pt performed sup<>sit EOB with close supervision and additional time to complete but no physical assist needed. He was also able to scoot while sitting EOB with supervision. Required repositioning of bed to flat in  order to perform scooting up in bed, no physical assist required.  Transfers Overall transfer level: Needs assistance Equipment used: Rolling walker (2 wheeled) Transfers: Sit to/from Stand Sit to Stand: Min guard         General transfer comment: Pt performed sit<>stand to RW with Min guard and no LOB in order to complete LB dressing over hips.    Balance Overall balance assessment: Needs assistance Sitting-balance support: Feet supported;No upper extremity supported Sitting balance-Leahy Scale: Normal     Standing balance support: Single extremity supported;During functional activity Standing balance-Leahy Scale: Good Standing balance comment: Pt performed standing while donning shorts over hips using one UE at a time with other supported on RW, no LOB.                   ADL Overall ADL's : Needs assistance/impaired             Lower Body Dressing: Set up;Minimal assistance;Cueing for compensatory techniques;With adaptive equipment;Cueing for safety;Sit to/from stand Lower Body Dressing Details (indicate cue type and reason): Pt performed LB dressing using A/E after training with Min Assist and verbal cues for safety and technique with no LOB in standing EOB with RW                      Vision                     Perception     Praxis      Cognition   Behavior During Therapy: [facility] for tasks assessed/performed Overall Cognitive Status: Within Functional Limits for tasks  assessed                       Extremity/Trunk Assessment            Exercises     Shoulder Instructions       General Comments      Pertinent Vitals/ Pain       Pain Assessment: 0-10 Pain Score: 7  Pain Location: L foot Pain Descriptors / Indicators: Constant;Aching Pain Intervention(s): Limited activity within patient's tolerance;Monitored during session  Home Living Family/patient expects to be discharged to:: Private residence Living  Arrangements: Parent Available Help at Discharge: Family Type of Home: House Home Access: Stairs to enter   Entrance Stairs-Rails: None Home Layout: One level     Bathroom Shower/Tub: Tub/shower unit Shower/tub characteristics: Curtain Firefighter: Standard Bathroom Accessibility: No   Home Equipment: Environmental consultant - 2 wheels   Additional Comments: Pt reports he lives at home with his mom who uses a WC and they both take care of each other. There is one step to get into house with no railing. Pt reports he does not have trouble getting up that step.      Prior Functioning/Environment Level of Independence: Independent        Comments: Pt reports he was independent with all home and community ambulation prior to admission.   Frequency Min 1X/week     Progress Toward Goals  OT Goals(current goals can now be found in the care plan section)  Progress towards OT goals: Progressing toward goals  Acute Rehab OT Goals Patient Stated Goal: To take care of myself again and my mother  OT Goal Formulation: With patient Time For Goal Achievement: 12/13/15 Potential to Achieve Goals: Good ADL Goals Pt Will Perform Lower Body Dressing: with adaptive equipment;with set-up;with min assist;sit to/from stand (with no LOB standing and using reacher and sock aid) Pt Will Transfer to Toilet: with min assist;stand pivot transfer;regular height toilet  Plan Frequency remains appropriate    Co-evaluation                 End of Session Equipment Utilized During Treatment: Rolling walker   Activity Tolerance Patient tolerated treatment well;No increased pain   Patient Left in bed;with call bell/phone within reach;with bed alarm set   Nurse Communication          Time: [phone] OT Time Calculation (min): 19 min  Charges: OT General Charges $OT Visit: 1 Procedure OT Evaluation $OT Eval Moderate Complexity: 1 Procedure OT Treatments $Self Care/Home Management : 8-22  mins  Eliezer Bottom, MPH, MS, OTR/L 11/30/2015, 9:48 AM

## 2015-11-30 NOTE — Progress Notes (Signed)
[facility] [place] [facility]   PATIENT NAME: Ruben Ward    MR#:  [id number]  DATE OF BIRTH:  July 19, 1955  SUBJECTIVE:   Patient blood sugar increased with D10 is feeling okay   REVIEW OF SYSTEMS:    Review of Systems  Constitutional: Negative for fever, chills and malaise/fatigue.  HENT: Negative for ear discharge, ear pain, hearing loss, nosebleeds and sore throat.   Eyes: Negative for blurred vision and pain.  Respiratory: Negative for cough, hemoptysis, shortness of breath and wheezing.   Cardiovascular: Negative for chest pain, palpitations and leg swelling.  Gastrointestinal: Negative for nausea, vomiting, abdominal pain, diarrhea and blood in stool.  Genitourinary: Negative for dysuria.  Musculoskeletal: Negative for back pain.  Neurological: Negative for dizziness, tremors, speech change, focal weakness, seizures and headaches.  Endo/Heme/Allergies: Does not bruise/bleed easily.  Psychiatric/Behavioral: Negative for depression, suicidal ideas and hallucinations.    Tolerating Diet:yes      DRUG ALLERGIES:   Allergies  Allergen Reactions  . Metformin And Related Other (See Comments)    Renal problems Doctor Solum said that kidney function was ok to take metformin currently    VITALS:  Blood pressure 171/80, pulse 70, temperature 98.3 F (36.8 C), temperature source Oral, resp. rate 20, height 5\' 6"  (1.676 m), weight 134.764 kg (297 lb 1.6 oz), SpO2 99 %.  PHYSICAL EXAMINATION:   Physical Exam  Constitutional: He is oriented to person, place, and time and well-developed, well-nourished, and in no distress. No distress.  obese  HENT:  Head: Normocephalic.  Eyes: No scleral icterus.  Neck: Normal range of motion. Neck supple. No JVD present. No tracheal deviation present.  Cardiovascular: Normal rate, regular rhythm and normal heart sounds.  Exam reveals no gallop and no friction rub.   No murmur heard. Pulmonary/Chest: Effort  normal and breath sounds normal. No respiratory distress. He has no wheezes. He has no rales. He exhibits no tenderness.  Abdominal: Soft. Bowel sounds are normal. He exhibits no distension and no mass. There is no tenderness. There is no rebound and no guarding.  Musculoskeletal: Normal range of motion. He exhibits no edema.  Neurological: He is alert and oriented to person, place, and time.  Skin: Skin is warm. No rash noted. No erythema.  Psychiatric: Affect and judgment normal.      LABORATORY PANEL:   CBC  Recent Labs Lab 11/28/15 2332  WBC 10.3  HGB 14.2  HCT 40.5  PLT 203   ------------------------------------------------------------------------------------------------------------------  Chemistries   Recent Labs Lab 11/28/15 2332  NA 141  K 4.7  CL 116*  CO2 20*  GLUCOSE 108*  BUN 58*  CREATININE 2.52*  CALCIUM 8.3*  AST 31  ALT 19  ALKPHOS 85  BILITOT 0.6   ------------------------------------------------------------------------------------------------------------------  Cardiac Enzymes  Recent Labs Lab 11/28/15 2332 11/29/15 0612 11/29/15 1212  TROPONINI 0.08* 0.06* 0.05*   ------------------------------------------------------------------------------------------------------------------  RADIOLOGY:  Dg Foot 2 Views Left  11/29/2015  CLINICAL DATA:  Status post multiple falls last night with left foot pain. Initial encounter. EXAM: LEFT FOOT - 2 VIEW COMPARISON:  Plain films left foot 06/19/2013. CT left foot 04/23/2015. FINDINGS: No acute bony or joint abnormality is identified. Remote healed fractures of the bases of the second and third metatarsals are noted. Prominent ossification in the distal Achilles is consistent with chronic tendinopathy. Degenerative change about the midfoot and first MTP joint is noted. Atherosclerotic calcifications are seen. IMPRESSION: No acute abnormality. Electronically Signed   By: Maisie Fushomas  Dalessio M.D.   On:  11/29/2015 07:59     ASSESSMENT AND PLAN:    60 year old male with a history of essential hypertension and diabetes who presented with weakness and falls.  1. Hypoglycemia:  Felt to be due to Amaryl which has been discontinued I will discontinue D10 to see what happens to his blood sugars have continued to be low then he will need to be worked up for hyperinsulinemia or cortisol deficiency   2. Acute kidney injury: Hold nephrotoxic agents. Continue IV fluids. Repeat BMP no blood work ordered for today I will check one today and tomorrow  3. Elevated troponin: Most likely due to poor renal clearance. Due to demand ischemia no cardiac symptoms   4. Generalized weakness: Suspect this is due to hypoglycemia. Patient also complaining of foot pain however x-ray does not show evidence of fracture.  5. Essential hypertension: Continue Norvasc, hydralazine and Lasix on hold due to elevated creatinine  6. Miscellaneous heparin for DVT prophylaxis   Management plans discussed with the patient and he is in agreement.  CODE STATUS: full  TOTAL TIME TAKING CARE OF THIS PATIENT: 28 minutes.   9:05-9:29  POSSIBLE D/C 1-2 days, DEPENDING ON CLINICAL CONDITION.   Auburn Bilberry M.D on 11/30/2015 at 11:35 AM  Between 7am to 6pm - Pager - [phone] After 6pm go to [email or web address] - password [employer]  [facility]  Office  [phone]  CC: Primary care physician; Rafael Bihari, MD  Note: This dictation was prepared with Dragon dictation along with smaller phrase technology. Any transcriptional errors that result from this process are unintentional.

## 2015-11-30 NOTE — Progress Notes (Signed)
Occupational Therapy Evaluation Patient Details Name: Ruben Ward MRN: [id number] DOB: 1955/08/16 Today's Date: 11/30/2015    History of Present Illness Pt admitted for elevated troponin and acute kidney injury and several falls at home   Clinical Impression    Pt is 60 year old male who presents with pain and decreased sensation in L LE and also presents with decreased sensation in L forearm elbow to hand only. He does not have any vision deficits except decreased convergence at end range. He has limited shoulder flexion in LUE from previous shoulder surgery. He requires max assist for LB dressing skills due to decreased balance, obesity, and pain in LLE. Discussed use of reacher and sock aid to increase independence and safety which will be a focus of OT. Rec a transfer tub bench at home and grab bars by toilet to help prevent further falls. Rec SNF for continued rehab since pt was helping to care for his mother who is in a wheelchair.    Follow Up Recommendations  SNF    Equipment Recommendations  Tub/shower bench    Recommendations for Other Services       Precautions / Restrictions Precautions Precautions: Fall Restrictions Weight Bearing Restrictions: No      Mobility Bed Mobility                  Transfers                      Balance                                            ADL Overall ADL's : Needs assistance/impaired Eating/Feeding: Independent   Grooming: Wash/dry hands;Wash/dry face;Oral care;Applying deodorant;Independent;Set up           Upper Body Dressing : Independent;Set up   Lower Body Dressing: Maximal assistance;Set up Lower Body Dressing Details (indicate cue type and reason): Decreased balance when leaning forward to reach feet and due to this and obesity and hx of falls, pt declined working on LB dressing any further.  Discussed and rec use of AD for LB dressing which pt was open to.                      Vision     Perception     Praxis      Pertinent Vitals/Pain Pain Assessment: 0-10 Pain Score: 7  Pain Location: Left foot Pain Descriptors / Indicators: Aching;Constant Pain Intervention(s): Limited activity within patient's tolerance     Hand Dominance Left   Extremity/Trunk Assessment Upper Extremity Assessment Upper Extremity Assessment: Overall WFL for tasks assessed;LUE deficits/detail LUE Deficits / Details: limited LUE shoulder flexion from previuos shoulder surgery 0-130 degrees LUE Sensation: decreased light touch   Lower Extremity Assessment Lower Extremity Assessment: Defer to PT evaluation       Communication Communication Communication: No difficulties   Cognition Arousal/Alertness: Awake/alert Behavior During Therapy: WFL for tasks assessed/performed Overall Cognitive Status: Within Functional Limits for tasks assessed                     General Comments       Exercises       Shoulder Instructions      Home Living Family/patient expects to be discharged to:: Private residence Living Arrangements: Parent Available Help at Discharge: Family Type  of Home: House Home Access: Stairs to enter   Entrance Stairs-Rails: None Home Layout: One level     Bathroom Shower/Tub: Tub/shower unit Shower/tub characteristics: Curtain FirefighterBathroom Toilet: Standard Bathroom Accessibility: No   Home Equipment: Environmental consultantWalker - 2 wheels   Additional Comments: Pt reports he lives at home with his mom who uses a WC and they both take care of each other. There is one step to get into house with no railing. Pt reports he does not have trouble getting up that step.      Prior Functioning/Environment Level of Independence: Independent        Comments: Pt reports he was independent with all home and community ambulation prior to admission.    OT Diagnosis: Generalized weakness;Acute pain   OT Problem List: Decreased strength;Decreased  range of motion;Decreased activity tolerance;Decreased safety awareness;Impaired sensation;Decreased knowledge of use of DME or AE;Pain   OT Treatment/Interventions: Self-care/ADL training;DME and/or AE instruction;Patient/family education;Therapeutic activities    OT Goals(Current goals can be found in the care plan section) Acute Rehab OT Goals Patient Stated Goal: To take care of myself again and my mother  OT Goal Formulation: With patient Time For Goal Achievement: 12/13/15 Potential to Achieve Goals: Good  OT Frequency: Min 1X/week   Barriers to D/C:            Co-evaluation              End of Session    Activity Tolerance: Patient tolerated treatment well Patient left: in chair;with chair alarm set;with call bell/phone within reach   Time: 1025-1100 OT Time Calculation (min): 35 min Charges:  OT General Charges $OT Visit: 1 Procedure OT Evaluation $OT Eval Moderate Complexity: 1 Procedure OT Treatments $Self Care/Home Management : 8-22 mins G-Codes:     Susanne BordersSusan Wofford, OTR/L ascom [phone] 11/30/2015, 9:31 AM

## 2015-12-01 LAB — BASIC METABOLIC PANEL
Anion gap: 8 (ref 5–15)
BUN: 43 mg/dL — ABNORMAL HIGH (ref 6–20)
CALCIUM: 8.4 mg/dL — AB (ref 8.9–10.3)
CHLORIDE: 109 mmol/L (ref 101–111)
CO2: 17 mmol/L — ABNORMAL LOW (ref 22–32)
CREATININE: 1.66 mg/dL — AB (ref 0.61–1.24)
GFR, EST AFRICAN AMERICAN: 50 mL/min — AB (ref >60)
GFR, EST NON AFRICAN AMERICAN: 43 mL/min — AB (ref >60)
Glucose, Bld: 181 mg/dL — ABNORMAL HIGH (ref 65–99)
Potassium: 5.3 mmol/L — ABNORMAL HIGH (ref 3.5–5.1)
SODIUM: 134 mmol/L — AB (ref 135–145)

## 2015-12-01 LAB — GLUCOSE, CAPILLARY
Glucose-Capillary: 191 mg/dL — ABNORMAL HIGH (ref 65–99)
Glucose-Capillary: 245 mg/dL — ABNORMAL HIGH (ref 65–99)
Glucose-Capillary: 210 mg/dL — ABNORMAL HIGH (ref 65–99)
Glucose-Capillary: 187 mg/dL — ABNORMAL HIGH (ref 65–99)

## 2015-12-01 LAB — CBC
HCT: 42.8 % (ref 40.0–52.0)
Hemoglobin: 14.8 g/dL (ref 13.0–18.0)
MCH: 30.6 pg (ref 26.0–34.0)
MCHC: 34.5 g/dL (ref 32.0–36.0)
MCV: 88.6 fL (ref 80.0–100.0)
PLATELETS: 164 10*3/uL (ref 150–440)
RBC: 4.83 MIL/uL (ref 4.40–5.90)
RDW: 14.8 % — AB (ref 11.5–14.5)
WBC: 6.8 10*3/uL (ref 3.8–10.6)

## 2015-12-01 MED ORDER — LABETALOL HCL 100 MG PO TABS
100.0000 mg | ORAL_TABLET | Freq: Two times a day (BID) | ORAL | Status: DC
  Administered 2015-12-01 – 2015-12-02 (×3): 100 mg via ORAL
  Filled 2015-12-01 (×3): qty 1

## 2015-12-01 MED ORDER — INSULIN GLARGINE 100 UNIT/ML ~~LOC~~ SOLN
8.0000 [IU] | Freq: Every day | SUBCUTANEOUS | Status: DC
  Administered 2015-12-01: 8 [IU] via SUBCUTANEOUS
  Filled 2015-12-01 (×2): qty 0.08

## 2015-12-01 NOTE — Progress Notes (Signed)
[facility] - [place] [facility]   PATIENT NAME: Ruben Ward    MR#:  [id number]  DATE OF BIRTH:  November 03, 1955  SUBJECTIVE:   Patient Sugar now elevated in the 200s. He is feeling well denies any complaints renal function improved   REVIEW OF SYSTEMS:    Review of Systems  Constitutional: Negative for fever, chills and Positive weakness  HENT: Negative for ear discharge, ear pain, hearing loss, nosebleeds and sore throat.   Eyes: Negative for blurred vision and pain.  Respiratory: Negative for cough, hemoptysis, shortness of breath and wheezing.   Cardiovascular: Negative for chest pain, palpitations and leg swelling.  Gastrointestinal: Negative for nausea, vomiting, abdominal pain, diarrhea and blood in stool.  Genitourinary: Negative for dysuria.  Musculoskeletal: Negative for back pain.  Neurological: Negative for dizziness, tremors, speech change, focal weakness, seizures and headaches.  Endo/Heme/Allergies: Does not bruise/bleed easily.  Psychiatric/Behavioral: Negative for depression, suicidal ideas and hallucinations.    Tolerating Diet:yes      DRUG ALLERGIES:   Allergies  Allergen Reactions  . Metformin And Related Other (See Comments)    Renal problems Doctor Solum said that kidney function was ok to take metformin currently    VITALS:  Blood pressure 174/62, pulse 69, temperature 98.2 F (36.8 C), temperature source Oral, resp. rate 20, height 5\' 6"  (1.676 m), weight 132.178 kg (291 lb 6.4 oz), SpO2 100 %.  PHYSICAL EXAMINATION:   Physical Exam  Constitutional: He is oriented to person, place, and time and well-developed, well-nourished, and in no distress. No distress.  obese  HENT:  Head: Normocephalic.  Eyes: No scleral icterus.  Neck: Normal range of motion. Neck supple. No JVD present. No tracheal deviation present.  Cardiovascular: Normal rate, regular rhythm and normal heart sounds.  Exam reveals no gallop and no friction  rub.   No murmur heard. Pulmonary/Chest: Effort normal and breath sounds normal. No respiratory distress. He has no wheezes. He has no rales. He exhibits no tenderness.  Abdominal: Soft. Bowel sounds are normal. He exhibits no distension and no mass. There is no tenderness. There is no rebound and no guarding.  Musculoskeletal: Normal range of motion. He exhibits no edema.  Neurological: He is alert and oriented to person, place, and time.  Skin: Skin is warm. No rash noted. No erythema.  Psychiatric: Affect and judgment normal.      LABORATORY PANEL:   CBC  Recent Labs Lab 12/01/15 0646  WBC 6.8  HGB 14.8  HCT 42.8  PLT 164   ------------------------------------------------------------------------------------------------------------------  Chemistries   Recent Labs Lab 11/28/15 2332  12/01/15 0646  NA 141  < > 134*  K 4.7  < > 5.3*  CL 116*  < > 109  CO2 20*  < > 17*  GLUCOSE 108*  < > 181*  BUN 58*  < > 43*  CREATININE 2.52*  < > 1.66*  CALCIUM 8.3*  < > 8.4*  AST 31  --   --   ALT 19  --   --   ALKPHOS 85  --   --   BILITOT 0.6  --   --   < > = values in this interval not displayed. ------------------------------------------------------------------------------------------------------------------  Cardiac Enzymes  Recent Labs Lab 11/28/15 2332 11/29/15 0612 11/29/15 1212  TROPONINI 0.08* 0.06* 0.05*   ------------------------------------------------------------------------------------------------------------------  RADIOLOGY:  No results found.   ASSESSMENT AND PLAN:    60 year old male with a history of essential hypertension and diabetes who presented with  weakness and falls.  1. Hypoglycemia:  Felt to be due to Amaryl  And victoza which has been discontinued Now patient hyperglycemic   I will start him on low-dose Lantus and continue sliding scale   2. Acute kidney injury on chronic kidney disease : Hold nephrotoxic renal function close to  baseline   3. Elevated troponin: Most likely due to poor renal clearance. Due to demand ischemia no cardiac symptoms   4. Generalized Patient needs rehabilitation according to physical therapy evaluation   5. Essential hypertension: Continue Norvasc, hydralazine blood pressure elevated increase  Add labetalol to current regimen 6. Miscellaneous heparin for DVT prophylaxis   Management plans discussed with the patient and he is in agreement.  CODE STATUS: full  TOTAL TIME TAKING CARE OF THIS PATIENT: 22 minutes.    Discharge tomorrow awaiting insurance authorization  Auburn Bilberry M.D on 12/01/2015 at 11:41 AM  Between 7am to 6pm - Pager - [phone] After 6pm go to [email or web address] - password [employer]  [facility] [place] [facility]  Office  [phone]  CC: Primary care physician; Rafael Bihari, MD  Note: This dictation was prepared with Dragon dictation along with smaller phrase technology. Any transcriptional errors that result from this process are unintentional.

## 2015-12-01 NOTE — Progress Notes (Signed)
CSW presented bed offers to patient. Accepted bed offer at [facility]. CSW informed Verlon AuLeslie- Admissions at [facility]. [facility]. Awaiting [employer] Medicare auth. MD informed CSW that patient will possibly discharge tomorrow. CSW will continue to follow and assist.   Woodroe Modehristina Koran Seabrook, MSW, LCSW-A, LCAS-A Clinical Social Worker [phone]

## 2015-12-02 LAB — GLUCOSE, CAPILLARY
Glucose-Capillary: 167 mg/dL — ABNORMAL HIGH (ref 65–99)
Glucose-Capillary: 254 mg/dL — ABNORMAL HIGH (ref 65–99)

## 2015-12-02 MED ORDER — LABETALOL HCL 100 MG PO TABS
100.0000 mg | ORAL_TABLET | Freq: Two times a day (BID) | ORAL | Status: DC
Start: 2015-12-02 — End: 2017-09-18

## 2015-12-02 MED ORDER — DOCUSATE SODIUM 100 MG PO CAPS: 100.0000 mg | ORAL_CAPSULE | Freq: Two times a day (BID) | ORAL | Status: AC

## 2015-12-02 MED ORDER — LIRAGLUTIDE 18 MG/3ML ~~LOC~~ SOPN: 1.2000 mg | PEN_INJECTOR | Freq: Every day | SUBCUTANEOUS | Status: AC

## 2015-12-02 NOTE — Discharge Summary (Signed)
Ruben Ward, 60 y.o., DOB 1955/11/14, MRN [id number]. Admission date: 11/28/2015 Discharge Date 12/02/2015 Primary MD Rafael Bihari, MD Admitting Physician Arnaldo Natal, MD  Admission Diagnosis  Weakness [R53.1] Elevated troponin [R79.89] Falls, initial encounter [phone].[id number]]  Discharge Diagnosis   Active Problems:   Acute renal failure on chronic kidney disease  Symptomatic hypoglycemia Generalized weakness and deconditioning Elevated troponin due to demand ischemia Essential hypertension Sleep apnea CPAP at bedtime History of endocarditis   Hospital Course * The patient with past medical history of hypertension and diabetes mellitus take 2 presents emergency department after recurrent falls. The patient's mother states that he fell out of bed on the morning of admission as well as the evening The patient has felt acutely weaker on day of admission than on previous days this week. I Upon arrival EMS found the patient's blood sugar to be in the 40s. He received D50 in the emergency department which improved his sugar.   Laboratory evaluation revealed acute kidney injury and elevated troponin. The patient has denied chest pain or shortness of breath. Due to his cardiovascular risk factors and elevated troponin in the emergency department staff called the hospitalist service for further evaluation. Patient blood sugars were felt to be low due to combination of his diabetic regimen and acute renal failure. Patient was hydrated with IV fluids his renal function is back to baseline. In terms of his hypoglycemia his diabetic regimen was held. Due to his elevated creatinine metformin has been stopped. His Amaryl has been stopped he is currently only on Victoza. His blood sugars will need to be checked at rehabilitation. And his regimen adjusted. He is very weak and deconditioned seen by physical therapy they recommended rehabilitation. Which is currently being arranged.                      Consults  None  Significant Tests:  See full reports for all details      Dg Foot 2 Views Left  11/29/2015  CLINICAL DATA:  Status post multiple falls last night with left foot pain. Initial encounter. EXAM: LEFT FOOT - 2 VIEW COMPARISON:  Plain films left foot 06/19/2013. CT left foot 04/23/2015. FINDINGS: No acute bony or joint abnormality is identified. Remote healed fractures of the bases of the second and third metatarsals are noted. Prominent ossification in the distal Achilles is consistent with chronic tendinopathy. Degenerative change about the midfoot and first MTP joint is noted. Atherosclerotic calcifications are seen. IMPRESSION: No acute abnormality. Electronically Signed   By: Drusilla Kanner M.D.   On: 11/29/2015 07:59       Today   Subjective:   Cheryln Manly  feeling better denies any complaints  Objective:   Blood pressure 143/88, pulse 67, temperature 97.6 F (36.4 C), temperature source Oral, resp. rate 20, height 5\' 6"  (1.676 m), weight 132.995 kg (293 lb 3.2 oz), SpO2 99 %.  .  Intake/Output Summary (Last 24 hours) at 12/02/15 1030 Last data filed at 12/01/15 1700  Gross per 24 hour  Intake    240 ml  Output      0 ml  Net    240 ml    Exam VITAL SIGNS: Blood pressure 143/88, pulse 67, temperature 97.6 F (36.4 C), temperature source Oral, resp. rate 20, height 5\' 6"  (1.676 m), weight 132.995 kg (293 lb 3.2 oz), SpO2 99 %.  GENERAL:  60 y.o.-year-old patient lying in the bed with no acute distress.  EYES:  Pupils equal, round, reactive to light and accommodation. No scleral icterus. Extraocular muscles intact.  HEENT: Head atraumatic, normocephalic. Oropharynx and nasopharynx clear.  NECK:  Supple, no jugular venous distention. No thyroid enlargement, no tenderness.  LUNGS: Normal breath sounds bilaterally, no wheezing, rales,rhonchi or crepitation. No use of accessory muscles of respiration.  CARDIOVASCULAR: S1, S2 normal. No  murmurs, rubs, or gallops.  ABDOMEN: Soft, nontender, nondistended. Bowel sounds present. No organomegaly or mass.  EXTREMITIES: No pedal edema, cyanosis, or clubbing.  NEUROLOGIC: Cranial nerves II through XII are intact. Muscle strength 5/5 in all extremities. Sensation intact. Gait not checked.  PSYCHIATRIC: The patient is alert and oriented x 3.  SKIN: No obvious rash, lesion, or ulcer.   Data Review     CBC w Diff: Lab Results  Component Value Date   WBC 6.8 12/01/2015   WBC 7.0 11/06/2012   HGB 14.8 12/01/2015   HGB 13.3 11/06/2012   HCT 42.8 12/01/2015   HCT 37.9* 11/06/2012   PLT 164 12/01/2015   PLT 200 11/06/2012   LYMPHOPCT 8 11/28/2015   MONOPCT 5 11/28/2015   EOSPCT 1 11/28/2015   BASOPCT 0 11/28/2015   CMP: Lab Results  Component Value Date   NA 134* 12/01/2015   NA 141 11/06/2012   K 5.3* 12/01/2015   K 4.9 11/06/2012   CL 109 12/01/2015   CL 111* 11/06/2012   CO2 17* 12/01/2015   CO2 21 11/06/2012   BUN 43* 12/01/2015   BUN 24* 11/06/2012   CREATININE 1.66* 12/01/2015   CREATININE 1.22 11/06/2012   PROT 6.9 11/28/2015   PROT 6.8 11/06/2012   ALBUMIN 3.7 11/28/2015   ALBUMIN 3.4 11/06/2012   BILITOT 0.6 11/28/2015   BILITOT 0.3 11/06/2012   ALKPHOS 85 11/28/2015   ALKPHOS 97 11/06/2012   AST 31 11/28/2015   AST 23 11/06/2012   ALT 19 11/28/2015   ALT 30 11/06/2012  .  Micro Results No results found for this or any previous visit (from the past 240 hour(s)).      Code Status Orders        Start     Ordered   11/29/15 0255  Full code   Continuous     11/29/15 0254    Code Status History    Date Active Date Inactive Code Status Order ID Comments User Context   This patient has a current code status but no historical code status.          Follow-up Information    Follow up with [facility].   Specialty:  Skilled Nursing Facility   Contact information:   [address] [place]  [place] [address] [phone]      Follow up with [place], Letta Pate, MD In 7 days.   Specialty:  Internal Medicine   Contact information:   [address] [facility] [place] [address] [phone]       Discharge Medications     Medication List    STOP taking these medications        furosemide 20 MG tablet  Commonly known as:  LASIX     glimepiride 4 MG tablet  Commonly known as:  AMARYL     metFORMIN 500 MG 24 hr tablet  Commonly known as:  GLUCOPHAGE-XR      TAKE these medications        amLODipine 10 MG tablet  Commonly known as:  NORVASC  Take 10 mg by  mouth daily.     cetirizine 10 MG tablet  Commonly known as:  ZYRTEC  Take 10 mg by mouth daily.     docusate sodium 100 MG capsule  Commonly known as:  COLACE  Take 1 capsule (100 mg total) by mouth 2 (two) times daily.     enalapril 20 MG tablet  Commonly known as:  VASOTEC  Take 20 mg by mouth 2 (two) times daily.     hydrALAZINE 100 MG tablet  Commonly known as:  APRESOLINE  Take 100 mg by mouth 2 (two) times daily.     labetalol 100 MG tablet  Commonly known as:  NORMODYNE  Take 1 tablet (100 mg total) by mouth 2 (two) times daily.     Liraglutide 18 MG/3ML Sopn  Commonly known as:  VICTOZA  Inject 0.2 mLs (1.2 mg total) into the skin daily.     sodium bicarbonate 650 MG tablet  Take 650 mg by mouth daily.           Total Time in preparing paper work, data evaluation and todays exam - 35 minutes  Auburn BilberryPATEL, Cecylia Brazill M.D on 12/02/2015 at 10:30 AM  [facility]   Office  [phone]

## 2015-12-02 NOTE — Plan of Care (Signed)
Problem: Acute Rehab OT Goals (only OT should resolve) Goal: Pt. Will Perform Lower Body Dressing Outcome: Completed/Met Date Met:  12/02/15 Pt able to doff/don socks with set up seated EOB with modified independence using reacher/sock aid. After set up, pt initiated LB dressing over feet with supervision using reacher while sitting EOB and completed donning of shorts over hips while standing EOB with Min guard.

## 2015-12-02 NOTE — Progress Notes (Signed)
CSW followed up with Gala Romneyoug- Admissions at [facility]. Per Gala Romneyoug he's awaiting auth for patient. Stated he'll call CSW when Berkley Harveyauth is received.  Woodroe Modehristina Kalsey Lull, MSW, LCSW-A, LCAS-A Clinical Social Worker [phone]

## 2015-12-02 NOTE — Discharge Instructions (Signed)
°  DIET:  Diabetic diet, cardiac diet  DISCHARGE CONDITION:  Stable  ACTIVITY:  Activity as tolerated  OXYGEN:  Home Oxygen: No.   Oxygen Delivery: room air  DISCHARGE LOCATION:  nursing home    ADDITIONAL DISCHARGE INSTRUCTION: SSI prior to each meal and bedtime Physical therapy eval and treat   If you experience worsening of your admission symptoms, develop shortness of breath, life threatening emergency, suicidal or homicidal thoughts you must seek medical attention immediately by calling 911 or calling your MD immediately  if symptoms less severe.  You Must read complete instructions/literature along with all the possible adverse reactions/side effects for all the Medicines you take and that have been prescribed to you. Take any new Medicines after you have completely understood and accpet all the possible adverse reactions/side effects.   Please note  You were cared for by a hospitalist during your hospital stay. If you have any questions about your discharge medications or the care you received while you were in the hospital after you are discharged, you can call the unit and asked to speak with the hospitalist on call if the hospitalist that took care of you is not available. Once you are discharged, your primary care physician will handle any further medical issues. Please note that NO REFILLS for any discharge medications will be authorized once you are discharged, as it is imperative that you return to your primary care physician (or establish a relationship with a primary care physician if you do not have one) for your aftercare needs so that they can reassess your need for medications and monitor your lab values.

## 2015-12-02 NOTE — Progress Notes (Signed)
Clinical Social Worker was informed that patient will be medically ready to discharge to [facility]. Patient in a agreement with plan. CSW called Doug- Admissions Coordinator at [facility] to confirm that patient's bed is ready. Provided patient's room number 412 and number to call for report [phone] . All discharge information faxed to [facility] via [employer]. Call to patient's sister left message to inform her patient would discharge to [facility]. RN will call report and patient will discharge to [facility] via EMS   Woodroe Modehristina Dub Maclellan, MSW, BudLCSW-A, LCAS-A Clinical Social Worker [phone]

## 2015-12-02 NOTE — Progress Notes (Signed)
CSW called Gala Romneyoug- [facility] asked for him to start [employer] auth. Patient does not have BCBS Medicare. Doug willing to take a 5 day LOG if auth isn't received today. Per MD patient is ready for discharge today.  Woodroe Modehristina Caymen Dubray, MSW, LCSW-A, LCAS-A Clinical Social Worker [phone]

## 2015-12-02 NOTE — Clinical Social Work Placement (Signed)
   CLINICAL SOCIAL WORK PLACEMENT  NOTE  Date:  12/02/2015  Patient Details  Name: Ruben Ward MRN: [id number] Date of Birth: 1956-02-28  Clinical Social Work is seeking post-discharge placement for this patient at the Skilled  Nursing Facility level of care (*CSW will initial, date and re-position this form in  chart as items are completed):  Yes   Patient/family provided with [facility] Clinical Social Work Department's list of facilities offering this level of care within the geographic area requested by the patient (or if unable, by the patient's family).  Yes   Patient/family informed of their freedom to choose among providers that offer the needed level of care, that participate in Medicare, Medicaid or managed care program needed by the patient, have an available bed and are willing to accept the patient.  Yes   Patient/family informed of 's ownership interest in [facility] and [facility], as well as of the fact that they are under no obligation to receive care at these facilities.  PASRR submitted to EDS on 11/29/15     PASRR number received on 11/29/15     Existing PASRR number confirmed on       FL2 transmitted to all facilities in geographic area requested by pt/family on       FL2 transmitted to all facilities within larger geographic area on       Patient informed that his/her managed care company has contracts with or will negotiate with certain facilities, including the following:        Yes   Patient/family informed of bed offers received.  Patient chooses bed at  [facility]     Physician recommends and patient chooses bed at      Patient to be transferred to  ([facility]) on 12/02/15.  Patient to be transferred to facility by  (EMS)     Patient family notified on 12/02/15 of transfer.  Name of family member notified:   Ruben Ward(Janice Fipps - Sister )     PHYSICIAN       Additional Comment:     _______________________________________________ Idamae Lusherhristina E Tonia Avino, LCSW 12/02/2015, 3:09 PM

## 2015-12-02 NOTE — Plan of Care (Signed)
Problem: Acute Rehab OT Goals (only OT should resolve) Goal: Pt. Will Transfer To Toilet Outcome: Completed/Met Date Met:  12/02/15 Pt performed toilet transfer with Min guard using RW and 1 grab bar with no loss of balance to regular height toilet.

## 2015-12-02 NOTE — Progress Notes (Signed)
Pt d/c to [facility] today.  [facility]  [facility] removed intact.  EMS called for transportation.

## 2015-12-02 NOTE — Progress Notes (Signed)
Occupational Therapy Treatment Patient Details Name: Ruben Ward MRN: [id number] DOB: [birth year] Today's Date: 12/02/2015    History of present illness Pt admitted for elevated troponin and acute kidney injury and several falls at home   OT comments  Pt presents today with 6/10 L foot pain with no increase during activity. Pt participated in grooming tasks, LB dressing, and toilet transfers with set up requiring supervision-Min guard. Pt has met all acute care OT goals and is ready to be discharged from OT at the hospital. 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    Follow Up Recommendations  SNF    Equipment Recommendations  Tub/shower bench    Recommendations for Other Services      Precautions / Restrictions Precautions Precautions: Fall Restrictions Weight Bearing Restrictions: No       Mobility Bed Mobility                  Transfers Overall transfer level: Needs assistance Equipment used: None Transfers: Sit to/from Stand Sit to Stand: Supervision         General transfer comment: Pt performed sit<>stand transfers from chair and EOB with close supervision and no LOB in order to ambulate to bathroom (using RW) and to complete LB dressing task.    Balance Overall balance assessment: Needs assistance Sitting-balance support: Feet supported;No upper extremity supported Sitting balance-Leahy Scale: Normal     Standing balance support: No upper extremity supported;During functional activity Standing balance-Leahy Scale: Good Standing balance comment: Pt able to complete donning of shorts over hips while in standing using BUE with no UB support needed                    ADL Overall ADL's : Needs assistance/impaired     Grooming: Wash/dry hands;Wash/dry face;Oral care;Set up;Supervision/safety;Standing Grooming Details  (indicate cue type and reason): Pt performed various grooming tasks while standing at sink with set up and supervision using sink counter for UB support occasionally.             Lower Body Dressing: Set up;Min guard;Supervision/safety;With adaptive equipment;Sit to/from stand Lower Body Dressing Details (indicate cue type and reason): Pt performed donning/doffing of socks with set up and A/E while seated EOB with supervision; pt initiated donning of shorts over feet while seated EOB with supervision and completed donning over hips in standing with Min guard and no LOB while pt used BUE to complete donning. Toilet Transfer: Supervision/safety;Regular Toilet;Grab bars;RW Armed forces technical officer Details (indicate cue type and reason): Pt performed toilet transfer to regular toilet using grab bar and RW requiring supervision for safety.         Functional mobility during ADLs: Min guard;Rolling walker;Cueing for safety General ADL Comments: Pt performed functional mobility during ADL with Min guard using RW and 1 VC for safe technique for turning with RW rather than backing up.      Vision                     Perception     Praxis      Cognition   Behavior During Therapy: [facility] for tasks assessed/performed Overall Cognitive Status: Within Functional Limits for tasks assessed                       Extremity/Trunk Assessment  Exercises     Shoulder Instructions       General Comments      Pertinent Vitals/ Pain       Pain Assessment: 0-10 Pain Score: 6  Pain Location: L foot Pain Intervention(s): Limited activity within patient's tolerance;Monitored during session;Repositioned  Home Living                                          Prior Functioning/Environment              Frequency       Progress Toward Goals  OT Goals(current goals can now be found in the care plan section)  Progress towards OT goals: Goals  met/education completed, patient discharged from [facility] Discharge plan remains appropriate;All goals met and education completed, patient discharged from OT services    Co-evaluation                 End of Session Equipment Utilized During Treatment: Rolling walker;Gait belt   Activity Tolerance Patient tolerated treatment well;No increased pain   Patient Left in chair;with call bell/phone within reach;with chair alarm set   Nurse Communication          Time: [phone] OT Time Calculation (min): 21 min  Charges: OT General Charges $OT Visit: 1 Procedure OT Treatments $Self Care/Home Management : 8-22 mins  Corky Sox, OTR/L 12/02/2015, 10:31 AM

## 2015-12-27 DIAGNOSIS — S93609A Unspecified sprain of unspecified foot, initial encounter: Secondary | ICD-10-CM

## 2015-12-27 HISTORY — DX: Unspecified sprain of unspecified foot, initial encounter: S93.609A

## 2016-04-20 ENCOUNTER — Other Ambulatory Visit: Payer: Self-pay | Admitting: Surgery

## 2016-04-20 DIAGNOSIS — K429 Umbilical hernia without obstruction or gangrene: Secondary | ICD-10-CM

## 2016-04-27 ENCOUNTER — Other Ambulatory Visit: Payer: Self-pay | Admitting: Surgery

## 2016-04-27 DIAGNOSIS — R160 Hepatomegaly, not elsewhere classified: Secondary | ICD-10-CM

## 2016-04-28 ENCOUNTER — Ambulatory Visit
Admission: RE | Admit: 2016-04-28 | Discharge: 2016-04-28 | Disposition: A | Discharge status: Home / Self Care | Payer: BLUE CROSS/BLUE SHIELD | Source: Ambulatory Visit | Attending: Surgery | Admitting: Surgery

## 2016-04-28 DIAGNOSIS — R160 Hepatomegaly, not elsewhere classified: Secondary | ICD-10-CM | POA: Insufficient documentation

## 2016-04-28 DIAGNOSIS — K429 Umbilical hernia without obstruction or gangrene: Secondary | ICD-10-CM | POA: Insufficient documentation

## 2016-04-28 DIAGNOSIS — K760 Fatty (change of) liver, not elsewhere classified: Secondary | ICD-10-CM | POA: Diagnosis not present

## 2016-08-19 DIAGNOSIS — G4733 Obstructive sleep apnea (adult) (pediatric): Secondary | ICD-10-CM | POA: Insufficient documentation

## 2016-08-19 DIAGNOSIS — E119 Type 2 diabetes mellitus without complications: Secondary | ICD-10-CM

## 2016-08-19 DIAGNOSIS — E7800 Pure hypercholesterolemia, unspecified: Secondary | ICD-10-CM

## 2016-08-19 HISTORY — DX: Obstructive sleep apnea (adult) (pediatric): G47.33

## 2016-08-19 HISTORY — DX: Pure hypercholesterolemia, unspecified: E78.00

## 2016-08-19 HISTORY — DX: Type 2 diabetes mellitus without complications: E11.9

## 2016-08-24 DIAGNOSIS — K429 Umbilical hernia without obstruction or gangrene: Secondary | ICD-10-CM

## 2016-08-24 DIAGNOSIS — M25569 Pain in unspecified knee: Secondary | ICD-10-CM

## 2016-08-24 HISTORY — DX: Umbilical hernia without obstruction or gangrene: K42.9

## 2016-08-24 HISTORY — DX: Pain in unspecified knee: M25.569

## 2016-09-30 DIAGNOSIS — R339 Retention of urine, unspecified: Secondary | ICD-10-CM

## 2016-09-30 HISTORY — DX: Retention of urine, unspecified: R33.9

## 2016-12-10 DIAGNOSIS — S92309A Fracture of unspecified metatarsal bone(s), unspecified foot, initial encounter for closed fracture: Secondary | ICD-10-CM

## 2016-12-10 HISTORY — DX: Fracture of unspecified metatarsal bone(s), unspecified foot, initial encounter for closed fracture: S92.309A

## 2016-12-18 ENCOUNTER — Emergency Department: Payer: BLUE CROSS/BLUE SHIELD

## 2016-12-18 ENCOUNTER — Observation Stay
Admission: EM | Admit: 2016-12-18 | Discharge: 2016-12-19 | Disposition: A | Discharge status: Home / Self Care | Payer: BLUE CROSS/BLUE SHIELD | Attending: Internal Medicine | Admitting: Internal Medicine

## 2016-12-18 DIAGNOSIS — J690 Pneumonitis due to inhalation of food and vomit: Secondary | ICD-10-CM

## 2016-12-18 DIAGNOSIS — T17320D Food in larynx causing asphyxiation, subsequent encounter: Secondary | ICD-10-CM

## 2016-12-18 DIAGNOSIS — E1122 Type 2 diabetes mellitus with diabetic chronic kidney disease: Secondary | ICD-10-CM | POA: Insufficient documentation

## 2016-12-18 DIAGNOSIS — R0600 Dyspnea, unspecified: Secondary | ICD-10-CM

## 2016-12-18 DIAGNOSIS — I129 Hypertensive chronic kidney disease with stage 1 through stage 4 chronic kidney disease, or unspecified chronic kidney disease: Secondary | ICD-10-CM | POA: Insufficient documentation

## 2016-12-18 DIAGNOSIS — I517 Cardiomegaly: Secondary | ICD-10-CM | POA: Insufficient documentation

## 2016-12-18 DIAGNOSIS — I10 Essential (primary) hypertension: Secondary | ICD-10-CM | POA: Insufficient documentation

## 2016-12-18 DIAGNOSIS — G4733 Obstructive sleep apnea (adult) (pediatric): Secondary | ICD-10-CM | POA: Diagnosis not present

## 2016-12-18 DIAGNOSIS — R0902 Hypoxemia: Secondary | ICD-10-CM | POA: Insufficient documentation

## 2016-12-18 DIAGNOSIS — T17308A Unspecified foreign body in larynx causing other injury, initial encounter: Secondary | ICD-10-CM | POA: Diagnosis present

## 2016-12-18 HISTORY — DX: Failed or difficult intubation, initial encounter: T88.4XXA

## 2016-12-18 HISTORY — DX: Unspecified asthma, uncomplicated: J45.909

## 2016-12-18 HISTORY — DX: Adverse effect of unspecified anesthetic, initial encounter: T41.45XA

## 2016-12-18 HISTORY — DX: Other complications of anesthesia, initial encounter: T88.59XA

## 2016-12-18 LAB — CBC WITH DIFFERENTIAL/PLATELET
BASOS PCT: 1 %
Basophils Absolute: 0.1 10*3/uL (ref 0–0.1)
EOS ABS: 0.2 10*3/uL (ref 0–0.7)
EOS PCT: 2 %
HCT: 38.4 % — ABNORMAL LOW (ref 40.0–52.0)
Hemoglobin: 13.5 g/dL (ref 13.0–18.0)
Lymphocytes Relative: 13 %
Lymphs Abs: 1.6 10*3/uL (ref 1.0–3.6)
MCH: 31.5 pg (ref 26.0–34.0)
MCHC: 35.1 g/dL (ref 32.0–36.0)
MCV: 89.9 fL (ref 80.0–100.0)
MONO ABS: 0.5 10*3/uL (ref 0.2–1.0)
MONOS PCT: 4 %
Neutro Abs: 9.9 10*3/uL — ABNORMAL HIGH (ref 1.4–6.5)
Neutrophils Relative %: 82 %
Platelets: 250 10*3/uL (ref 150–440)
RBC: 4.27 MIL/uL — ABNORMAL LOW (ref 4.40–5.90)
RDW: 14.1 % (ref 11.5–14.5)
WBC: 12.2 10*3/uL — ABNORMAL HIGH (ref 3.8–10.6)

## 2016-12-18 LAB — BLOOD GAS, ARTERIAL
Acid-base deficit: 10.9 mmol/L — ABNORMAL HIGH (ref 0.0–2.0)
BICARBONATE: 15.9 mmol/L — AB (ref 20.0–28.0)
FIO2: 1
O2 SAT: 99.7 %
PATIENT TEMPERATURE: 37
PO2 ART: 233 mmHg — AB (ref 83.0–108.0)
pCO2 arterial: 38 mmHg (ref 32.0–48.0)
pH, Arterial: 7.23 — ABNORMAL LOW (ref 7.350–7.450)

## 2016-12-18 LAB — COMPREHENSIVE METABOLIC PANEL
ALBUMIN: 3.2 g/dL — AB (ref 3.5–5.0)
ALT: 30 U/L (ref 17–63)
ANION GAP: 11 (ref 5–15)
AST: 46 U/L — ABNORMAL HIGH (ref 15–41)
Alkaline Phosphatase: 105 U/L (ref 38–126)
BILIRUBIN TOTAL: 0.7 mg/dL (ref 0.3–1.2)
BUN: 40 mg/dL — ABNORMAL HIGH (ref 6–20)
CO2: 19 mmol/L — AB (ref 22–32)
Calcium: 7.9 mg/dL — ABNORMAL LOW (ref 8.9–10.3)
Chloride: 107 mmol/L (ref 101–111)
Creatinine, Ser: 2.39 mg/dL — ABNORMAL HIGH (ref 0.61–1.24)
GFR calc Af Amer: 32 mL/min — ABNORMAL LOW (ref >60)
GFR calc non Af Amer: 28 mL/min — ABNORMAL LOW (ref >60)
GLUCOSE: 350 mg/dL — AB (ref 65–99)
POTASSIUM: 5.3 mmol/L — AB (ref 3.5–5.1)
SODIUM: 137 mmol/L (ref 135–145)
TOTAL PROTEIN: 5.9 g/dL — AB (ref 6.5–8.1)

## 2016-12-18 LAB — TROPONIN I: Troponin I: 0.04 ng/mL (ref <0.03)

## 2016-12-18 LAB — GLUCOSE, CAPILLARY: Glucose-Capillary: 278 mg/dL — ABNORMAL HIGH (ref 65–99)

## 2016-12-18 MED ORDER — DEXTROSE 5 % IV SOLN
600.0000 mg | Freq: Once | INTRAVENOUS | Status: AC
  Administered 2016-12-18: 600 mg via INTRAVENOUS
  Filled 2016-12-18: qty 4

## 2016-12-18 MED ORDER — ONDANSETRON HCL 4 MG/2ML IJ SOLN
8.0000 mg | Freq: Once | INTRAMUSCULAR | Status: AC
Start: 2016-12-18 — End: 2016-12-18
  Administered 2016-12-18: 8 mg via INTRAVENOUS

## 2016-12-18 MED ORDER — SODIUM CHLORIDE 0.9 % IV BOLUS (SEPSIS)
1000.0000 mL | Freq: Once | INTRAVENOUS | Status: AC
  Administered 2016-12-18: 1000 mL via INTRAVENOUS

## 2016-12-18 NOTE — H&P (Signed)
[employer] [facility] [place] at [facility]   PATIENT NAME: Ruben Ward    MR#:  [id number]  DATE OF BIRTH:  1955/12/26  DATE OF ADMISSION:  12/18/2016  PRIMARY CARE PHYSICIAN: Rafael BihariWalker, John B III, MD   REQUESTING/REFERRING PHYSICIAN: Minna AntisPaduchowski, Kevin, MD  CHIEF COMPLAINT:   Chief Complaint  Patient presents with  . Respiratory Distress    HISTORY OF PRESENT ILLNESS:  Ruben Ward  is a 61 y.o. male with a known history of Diabetes, hypertension, is being admitted status post choking episode.  Patient was at [employer] last evening eating spaghetti and he choked on food there.  EMS was called, According to EMS report he began choking and coughing fell backwards out of his seat and was unconscious upon their arrival. EMS states they performed a chest blows and ventilations. States the patient was extremely cyanotic upon their arrival. In route to the hospital the patient became combative. Upon arrival to the emergency department the patient is unresponsive but eyes are awake, patient is extremely cyanotic appearing in the face and significantly diaphoretic. Patient is not able to answer any questions or follow any commands at this time.  Patient had an initial O2 saturation of 60% on room air. Patient was placed on a nonrebreather mask. He began preparing to intubate area within approximately 30 seconds to 1 minute on a nonrebreather the patient began coughing, O2 sat increased to 98% and he became arousable, was able to open his eyes to command squeeze fingers to commands. Within approximate 5-10 minutes the patient was able to answer questions and speak to us. Patient denies any chest pain or trouble breathing currently. He is satting 98% on a nonrebreather, however he does desat into the 80s when taken off the nonrebreather. We attempted to place briefly on BiPAP given the continued desaturations off nonrebreather however patient began feeling nauseated, was able  to remove the mass just before the patient began vomiting.  EDP was concerned about possible aspiration pneumonia.  When I evaluated the patient.  He is already on nasal cannula, saturating more than 95%.  He seems very comfortable. PAST MEDICAL HISTORY:  No past medical history on file. Diabetes Hypertension PAST SURGICAL HISTORY:  No past surgical history on file. None SOCIAL HISTORY:   Social History  Substance Use Topics  . Smoking status: Not on file  . Smokeless tobacco: Not on file  . Alcohol use Not on file  Nonsmoker  FAMILY HISTORY:  No family history on file. Mother with hypertension DRUG ALLERGIES:   Allergies  Allergen Reactions  . Glucophage [Metformin]     REVIEW OF SYSTEMS:   Review of Systems  Constitutional: Negative for chills, fever and weight loss.  HENT: Negative for nosebleeds and sore throat.   Eyes: Negative for blurred vision.  Respiratory: Negative for cough, shortness of breath and wheezing.   Cardiovascular: Negative for chest pain, orthopnea, leg swelling and PND.  Gastrointestinal: Negative for abdominal pain, constipation, diarrhea, heartburn, nausea and vomiting.  Genitourinary: Negative for dysuria and urgency.  Musculoskeletal: Negative for back pain.  Skin: Negative for rash.  Neurological: Negative for dizziness, speech change, focal weakness and headaches.  Endo/Heme/Allergies: Does not bruise/bleed easily.  Psychiatric/Behavioral: Negative for depression.    MEDICATIONS AT HOME:   Prior to Admission medications   Medication Sig Start Date End Date Taking? Authorizing Provider  AmLODIPine Besylate (NORVASC PO) Take 1 tablet by mouth daily.   Yes [provider]  cetirizine (ZYRTEC) 10  MG tablet Take 10 mg by mouth daily.   Yes [provider]  ENALAPRIL MALEATE PO Take 1 tablet by mouth 2 (two) times daily.   Yes [provider]  Furosemide (LASIX PO) Take 1 tablet by mouth daily.   Yes [provider]  Pioglitazone HCl (ACTOS PO) Take 1 tablet by mouth daily.   Yes [provider]  sodium bicarbonate 650 MG tablet Take 650 mg by mouth 2 (two) times daily.   Yes [provider]      VITAL SIGNS:  Blood pressure (!) 121/58, pulse 83, temperature (!) 97 F (36.1 C), temperature source Axillary, resp. rate (!) 25, weight (!) 180 kg (396 lb 13.3 oz), SpO2 97 %.  PHYSICAL EXAMINATION:  Physical Exam  GENERAL:  61 y.o.-year-old patient lying in the bed with no acute distress.  EYES: Pupils equal, round, reactive to light and accommodation. No scleral icterus. Extraocular muscles intact.  HEENT: Head atraumatic, normocephalic. Oropharynx and nasopharynx clear.  NECK:  Supple, no jugular venous distention. No thyroid enlargement, no tenderness.  LUNGS: Normal breath sounds bilaterally, no wheezing, rales,rhonchi or crepitation. No use of accessory muscles of respiration.  CARDIOVASCULAR: S1, S2 normal. No murmurs, rubs, or gallops.  ABDOMEN: Soft, nontender, nondistended. Bowel sounds present. No organomegaly or mass.  EXTREMITIES: No pedal edema, cyanosis, or clubbing.  NEUROLOGIC: Cranial nerves II through XII are intact. Muscle strength 5/5 in all extremities. Sensation intact. Gait not checked.  PSYCHIATRIC: The patient is alert and oriented x 3.  SKIN: No obvious rash, lesion, or ulcer.   LABORATORY PANEL:   CBC  Recent Labs Lab 12/18/16 2028  WBC 12.2*  HGB 13.5  HCT 38.4*  PLT 250   ------------------------------------------------------------------------------------------------------------------  Chemistries   Recent Labs Lab 12/18/16 2028  NA 137  K PENDING  CL PENDING  CO2 PENDING  GLUCOSE PENDING  BUN PENDING  CREATININE PENDING  CALCIUM PENDING  AST PENDING  ALT PENDING  ALKPHOS PENDING  BILITOT PENDING    ------------------------------------------------------------------------------------------------------------------  Cardiac Enzymes No results for input(s): TROPONINI in the last 168 hours. ------------------------------------------------------------------------------------------------------------------  RADIOLOGY:  Dg Chest 1 View  Result Date: 12/18/2016 CLINICAL DATA:  61 year old male with choking sensation. EXAM: CHEST 1 VIEW COMPARISON:  None. FINDINGS: There is mild cardiomegaly with mild vascular congestion. No focal consolidation, pleural effusion, or pneumothorax. Left shoulder arthroplasty. No acute osseous pathology. IMPRESSION: Cardiomegaly with mild vascular congestion.  No focal consolidation. Electronically Signed   By: Elgie Collard M.D.   On: 12/18/2016 19:51      IMPRESSION AND PLAN:  61 year old male with a history of diabetes, hypertension being admitted for choking and hypoxia, aspiration?  * Possible aspiration event - EGD has given him clindamycin for possible aspiration pneumonia, although I do not see anything on chest x-ray worrisome for pneumonia at this time. - We will monitor him, check pro-calcitonin  - Hold off any further antibiotics  * Diabetes mellitus - Continue home medications  * Hypertension - Continue Norvasc and ACE inhibitor  * Obstructive sleep apnea - CPAP/BiPAP at night    All the records are reviewed and case discussed with ED provider. Management plans discussed with the patient, family and they are in agreement.  CODE STATUS: Full code  TOTAL TIME TAKING CARE OF THIS PATIENT: 35 minutes.    Delfino Lovett M.D on 12/18/2016 at 11:01 PM  Between 7am to 6pm - Pager - [phone]  After 6pm go to [email or web address] - password [email or web address]  [employer] [facility]  Office  [phone]  CC: Primary care physician; Rafael BihariWalker, John B III, MD   Note: This dictation was prepared with Dragon dictation along with smaller  phrase technology. Any transcriptional errors that result from this process are unintentional.

## 2016-12-18 NOTE — ED Notes (Signed)
Critical troponin of 0.04 called from lab. Dr. Lenard Lancepaduchowski notified, no new orders received.

## 2016-12-18 NOTE — ED Provider Notes (Signed)
[facility] Emergency Department Provider Note  Time seen: 8:23 PM  I have reviewed the triage vital signs and the nursing notes.   HISTORY  Chief Complaint Respiratory Distress    HPI Ruben ManlyDonald Ward is a 61 y.o. male with a past medical history of diabetes, obesity, who presents to the emergency department a emergency traffic for dyspnea/syncope. According to EMS report the patient was at a restaurant eating when he began choking and coughing fell backwards out of his seat and was unconscious upon their arrival. EMS states they performed a chest blows and ventilations. States the patient was extremely cyanotic upon their arrival. In route to the hospital the patient became combative. Upon arrival to the emergency department the patient is unresponsive but eyes are awake, patient is extremely cyanotic appearing in the face and significantly diaphoretic. Patient is not able to answer any questions or follow any commands at this time.   There are no active problems to display for this patient.   No past surgical history on file.  Prior to Admission medications   Not on File    Allergies  Allergen Reactions  . Glucophage [Metformin]     No family history on file.  Social History Social History  Substance Use Topics  . Smoking status: Not on file  . Smokeless tobacco: Not on file  . Alcohol use Not on file    Review of Systems Unable to obtain an adequate review of systems due to unresponsiveness/altered mental status.   ____________________________________________   PHYSICAL EXAM:  VITAL SIGNS: ED Triage Vitals  Enc Vitals Group     BP 12/18/16 1931 (!) 151/77     Pulse Rate 12/18/16 1930 (!) 122     Resp 12/18/16 1930 (!) 24     Temp 12/18/16 1956 (!) 97 F (36.1 C)     Temp Source 12/18/16 1956 Axillary     SpO2 12/18/16 1930 97 %     Weight 12/18/16 1925 (!) 396 lb 13.3 oz (180 kg)     Height --      Head Circumference --       Peak Flow --      Pain Score --      Pain Loc --      Pain Edu? --      Excl. in GC? --     Constitutional: Extremities cyanotic, eyes are open but the patient is not responding to voice, physical stimuli or following commands.  Eyes: Normal exam ENT   Head: Normocephalic and atraumatic   Mouth/Throat: Mucous membranes are moist. Cardiovascular: Normal rate, regular rhythm. Respiratory:  patient is breathing on his own, diffuse rhonchi on exam.  Gastrointestinal:  extremely obese exam, soft, no reaction to palpation but the patient is largely unresponsive.  Musculoskeletal: Atraumatic. Extremities Neurologic:  Unresponsive to command in voice. Skin:  Skin is diaphoretic, pale/cyanotic Psychiatric: Unable to assess psychiatrically  __________________________________  EKG reviewed and interpreted by myself shows sinus tachycardia 115 bpm, widened QRS, normal axis, largely normal intervals with fairly diffuse ST depressions.  ________________________    RADIOLOGY  Chest x-ray shows cardiomegaly with vascular congestion without focal consolidation.   ____________________________________________   INITIAL IMPRESSION / ASSESSMENT AND PLAN / ED COURSE  Pertinent labs & imaging results that were available during my care of the patient were reviewed by me and considered in my medical decision making (see chart for details).  Patient presents to the emergency department emergency traffic after apparently choking. Upon  arrival patient has his eyes open but is unresponsive appears altered, does not respond to commands does not follow commands, does not respond to voice. Patient is cyanotic and diaphoretic. Patient has an initial O2 saturation of 60% on room air. Patient is placed on a nonrebreather mask. He began preparing to intubate area within approximately 30 seconds to 1 minute on a nonrebreather the patient began coughing, O2 sat increased to 98% and he became arousable, was  able to open his eyes to command squeeze fingers to commands. Within approximate 5-10 minutes the patient was able to answer questions and speak to us. Patient denies any chest pain or trouble breathing currently. He is satting 98% on a nonrebreather however he does desat into the 80s when taken off the nonrebreather. We attempted to place briefly on BiPAP given the continued desaturations off nonrebreather however patient began feeling nauseated, was able to remove the mass just before the patient began vomiting. My initial view of the chest x-ray (before vomiting in ED) appears to show consolidations/fluid especially in the right lower lobe. Given the history of choking we will cover with IV clindamycin for likely aspiration. Patient is now awake alert and oriented. He will require admission to the hospital for further treatment.    CRITICAL CARE Performed by: Minna AntisPADUCHOWSKI, Amandeep Nesmith   Total critical care time: 45 minutes  Critical care time was exclusive of separately billable procedures and treating other patients.  Critical care was necessary to treat or prevent imminent or life-threatening deterioration.  Critical care was time spent personally by me on the following activities: development of treatment plan with patient and/or surrogate as well as nursing, discussions with consultants, evaluation of patient's response to treatment, examination of patient, obtaining history from patient or surrogate, ordering and performing treatments and interventions, ordering and review of laboratory studies, ordering and review of radiographic studies, pulse oximetry and re-evaluation of patient's condition.   ____________________________________________   FINAL CLINICAL IMPRESSION(S) / ED DIAGNOSIS:  CHOKING HYPOXIA ASPIRATION    Minna AntisPaduchowski, Rob Mciver, MD 12/18/16 2141

## 2016-12-18 NOTE — ED Notes (Signed)
Pt switched to nasal cannula at 2lpm from nonrebreather per md request. Pt tolerating nasal cannula.

## 2016-12-18 NOTE — ED Notes (Signed)
Family updated with pt consent. Family at bedside.

## 2016-12-18 NOTE — ED Notes (Signed)
Pt sleeping. 

## 2016-12-18 NOTE — ED Notes (Signed)
Pt without cyanosis noted. Pt is able to speak in full sentences and is conversing with family. Breath sounds remain with rhonchi in all lung fields.

## 2016-12-18 NOTE — ED Notes (Signed)
Pt cleansed of large amount of stool and previous emesis.

## 2016-12-18 NOTE — Progress Notes (Signed)
Pt placed on Bipap for a approx. then complained of feeling sick. Removed Bipap mask just before pt bean to vomit. Pt returned to 100% NRM mask.

## 2016-12-18 NOTE — ED Notes (Signed)
Ed techs in to assist pt with bedpan and urinal again.

## 2016-12-18 NOTE — ED Notes (Signed)
Pt assisted unto bedpan for bowel movement, sheets changed due to soiling.

## 2016-12-18 NOTE — ED Notes (Signed)
Pt with large amount of undigested food emesis. bipap removed due to vomiting, non rebreather placed at 15lpm again. Pt with improved color, no cyanosis noted at all. Pt is alert and able to answer questions now.

## 2016-12-18 NOTE — ED Triage Notes (Signed)
Pt arrives ems after choking at [employer]. Pt is diaphoretic, having difficulty clearing airway per ems. Pt localizes to pain, but is not verbally responsive. Cyanosis noted.

## 2016-12-18 NOTE — ED Notes (Signed)
Pt denies needs, pt updated on delay for admission.

## 2016-12-19 LAB — BASIC METABOLIC PANEL WITH GFR
Anion gap: 6 (ref 5–15)
BUN: 45 mg/dL — ABNORMAL HIGH (ref 6–20)
CO2: 23 mmol/L (ref 22–32)
Calcium: 7.7 mg/dL — ABNORMAL LOW (ref 8.9–10.3)
Chloride: 110 mmol/L (ref 101–111)
Creatinine, Ser: 2.28 mg/dL — ABNORMAL HIGH (ref 0.61–1.24)
GFR calc Af Amer: 34 mL/min — ABNORMAL LOW
GFR calc non Af Amer: 29 mL/min — ABNORMAL LOW
Glucose, Bld: 379 mg/dL — ABNORMAL HIGH (ref 65–99)
Potassium: 5 mmol/L (ref 3.5–5.1)
Sodium: 139 mmol/L (ref 135–145)

## 2016-12-19 LAB — CBC
HCT: 33.9 % — ABNORMAL LOW (ref 40.0–52.0)
HEMOGLOBIN: 12 g/dL — AB (ref 13.0–18.0)
MCH: 31.5 pg (ref 26.0–34.0)
MCHC: 35.3 g/dL (ref 32.0–36.0)
MCV: 89.3 fL (ref 80.0–100.0)
Platelets: 167 10*3/uL (ref 150–440)
RBC: 3.8 MIL/uL — AB (ref 4.40–5.90)
RDW: 13.8 % (ref 11.5–14.5)
WBC: 8.7 10*3/uL (ref 3.8–10.6)

## 2016-12-19 LAB — GLUCOSE, CAPILLARY
GLUCOSE-CAPILLARY: 305 mg/dL — AB (ref 65–99)
GLUCOSE-CAPILLARY: 262 mg/dL — AB (ref 65–99)
Glucose-Capillary: 341 mg/dL — ABNORMAL HIGH (ref 65–99)

## 2016-12-19 MED ORDER — SODIUM CHLORIDE 0.9 % IV SOLN
INTRAVENOUS | Status: DC
  Administered 2016-12-19: 01:00:00 via INTRAVENOUS

## 2016-12-19 MED ORDER — PIOGLITAZONE HCL 15 MG PO TABS
15.0000 mg | ORAL_TABLET | Freq: Every day | ORAL | Status: DC
  Administered 2016-12-19: 15 mg via ORAL
  Filled 2016-12-19: qty 1

## 2016-12-19 MED ORDER — BISACODYL 5 MG PO TBEC: 5.0000 mg | DELAYED_RELEASE_TABLET | Freq: Every day | ORAL | Status: DC | PRN

## 2016-12-19 MED ORDER — HYDROCODONE-ACETAMINOPHEN 5-325 MG PO TABS: 1.0000 | ORAL_TABLET | ORAL | Status: DC | PRN

## 2016-12-19 MED ORDER — ONDANSETRON HCL 4 MG/2ML IJ SOLN: 4.0000 mg | Freq: Four times a day (QID) | INTRAMUSCULAR | Status: DC | PRN

## 2016-12-19 MED ORDER — ACETAMINOPHEN 650 MG RE SUPP: 650.0000 mg | Freq: Four times a day (QID) | RECTAL | Status: DC | PRN

## 2016-12-19 MED ORDER — ONDANSETRON HCL 4 MG PO TABS: 4.0000 mg | ORAL_TABLET | Freq: Four times a day (QID) | ORAL | Status: DC | PRN

## 2016-12-19 MED ORDER — AMLODIPINE BESYLATE 5 MG PO TABS
2.5000 mg | ORAL_TABLET | Freq: Every day | ORAL | Status: DC
  Administered 2016-12-19: 2.5 mg via ORAL
  Filled 2016-12-19: qty 1

## 2016-12-19 MED ORDER — HEPARIN SODIUM (PORCINE) 5000 UNIT/ML IJ SOLN
5000.0000 [IU] | Freq: Three times a day (TID) | INTRAMUSCULAR | Status: DC
  Administered 2016-12-19: 5000 [IU] via SUBCUTANEOUS
  Filled 2016-12-19: qty 1

## 2016-12-19 MED ORDER — INSULIN ASPART 100 UNIT/ML ~~LOC~~ SOLN
0.0000 [IU] | Freq: Three times a day (TID) | SUBCUTANEOUS | Status: DC
Start: 2016-12-19 — End: 2016-12-19
  Administered 2016-12-19: 15 [IU] via SUBCUTANEOUS
  Administered 2016-12-19: 11 [IU] via SUBCUTANEOUS
  Filled 2016-12-19 (×2): qty 1

## 2016-12-19 MED ORDER — ENALAPRIL MALEATE 2.5 MG PO TABS
2.5000 mg | ORAL_TABLET | Freq: Two times a day (BID) | ORAL | Status: DC
  Administered 2016-12-19: 2.5 mg via ORAL
  Filled 2016-12-19 (×3): qty 1

## 2016-12-19 MED ORDER — LORATADINE 10 MG PO TABS
10.0000 mg | ORAL_TABLET | Freq: Every day | ORAL | Status: DC
  Administered 2016-12-19: 10 mg via ORAL
  Filled 2016-12-19: qty 1

## 2016-12-19 MED ORDER — SODIUM BICARBONATE 650 MG PO TABS
650.0000 mg | ORAL_TABLET | Freq: Two times a day (BID) | ORAL | Status: DC
Start: 2016-12-19 — End: 2016-12-19
  Administered 2016-12-19 (×2): 650 mg via ORAL
  Filled 2016-12-19 (×2): qty 1

## 2016-12-19 MED ORDER — INSULIN ASPART 100 UNIT/ML ~~LOC~~ SOLN
0.0000 [IU] | Freq: Every day | SUBCUTANEOUS | Status: DC
  Administered 2016-12-19: 4 [IU] via SUBCUTANEOUS
  Filled 2016-12-19: qty 1

## 2016-12-19 MED ORDER — ACETAMINOPHEN 325 MG PO TABS
650.0000 mg | ORAL_TABLET | Freq: Four times a day (QID) | ORAL | Status: DC | PRN
Start: 2016-12-19 — End: 2016-12-19

## 2016-12-19 MED ORDER — DOCUSATE SODIUM 100 MG PO CAPS
100.0000 mg | ORAL_CAPSULE | Freq: Two times a day (BID) | ORAL | Status: DC
  Administered 2016-12-19 (×2): 100 mg via ORAL
  Filled 2016-12-19 (×2): qty 1

## 2016-12-19 MED ORDER — FUROSEMIDE 20 MG PO TABS
20.0000 mg | ORAL_TABLET | Freq: Every day | ORAL | Status: DC
  Administered 2016-12-19: 20 mg via ORAL
  Filled 2016-12-19: qty 1

## 2016-12-19 NOTE — Discharge Summary (Signed)
[employer] [facility] - [place] at [facility]   PATIENT NAME: Ruben Ward    MR#:  [id number]  DATE OF BIRTH:  1956/01/13  DATE OF ADMISSION:  12/18/2016   ADMITTING PHYSICIAN: Delfino LovettVipul Shah, MD  DATE OF DISCHARGE: 12/19/2016 PRIMARY CARE PHYSICIAN: Rafael BihariWalker, John B III, MD   ADMISSION DIAGNOSIS:  Choking, initial encounter [T17.308A] Dyspnea, unspecified type [R06.00] Aspiration pneumonia of right lower lobe, unspecified aspiration pneumonia type (HCC) [J69.0] DISCHARGE DIAGNOSIS:  Active Problems:   Choking due to food (regurgitated), subsequent encounter  SECONDARY DIAGNOSIS:  No past medical history on file. HOSPITAL COURSE:  61 year old male with a history of diabetes, hypertension being admitted for choking and hypoxia, aspiration?  * Possible aspiration event - ED has given him clindamycin for possible aspiration pneumonia, No pneumonia per CXR. - Hold off any further antibiotics  * Diabetes mellitus - Continue home medications  * Hypertension - Continue Norvasc and ACE inhibitor  * Obstructive sleep apnea - CPAP/BiPAP at night  * CKD stage 4, stable.  The patient has no complaints. DISCHARGE CONDITIONS:  He is clinically stable, discharge home today. CONSULTS OBTAINED:   DRUG ALLERGIES:   Allergies  Allergen Reactions  . Glucophage [Metformin]    DISCHARGE MEDICATIONS:   Allergies as of 12/19/2016      Reactions   Glucophage [metformin]       Medication List    TAKE these medications   ACTOS PO Take 1 tablet by mouth daily.   cetirizine 10 MG tablet Commonly known as:  ZYRTEC Take 10 mg by mouth daily.   ENALAPRIL MALEATE PO Take 1 tablet by mouth 2 (two) times daily.   LASIX PO Take 1 tablet by mouth daily.   NORVASC PO Take 1 tablet by mouth daily.   sodium bicarbonate 650 MG tablet Take 650 mg by mouth 2 (two) times daily.        DISCHARGE INSTRUCTIONS:  See AVS.  If you experience worsening of your admission  symptoms, develop shortness of breath, life threatening emergency, suicidal or homicidal thoughts you must seek medical attention immediately by calling 911 or calling your MD immediately  if symptoms less severe.  You Must read complete instructions/literature along with all the possible adverse reactions/side effects for all the Medicines you take and that have been prescribed to you. Take any new Medicines after you have completely understood and accpet all the possible adverse reactions/side effects.   Please note  You were cared for by a hospitalist during your hospital stay. If you have any questions about your discharge medications or the care you received while you were in the hospital after you are discharged, you can call the unit and asked to speak with the hospitalist on call if the hospitalist that took care of you is not available. Once you are discharged, your primary care physician will handle any further medical issues. Please note that NO REFILLS for any discharge medications will be authorized once you are discharged, as it is imperative that you return to your primary care physician (or establish a relationship with a primary care physician if you do not have one) for your aftercare needs so that they can reassess your need for medications and monitor your lab values.    On the day of Discharge:  VITAL SIGNS:  Blood pressure (!) 153/66, pulse 61, temperature 98.3 F (36.8 C), temperature source Oral, resp. rate 20, height 5\' 6"  (1.676 m), weight (!) 302 lb 1.6 oz (137 kg), SpO2  92 %. PHYSICAL EXAMINATION:  GENERAL:  61 y.o.-year-old patient lying in the bed with no acute distress. Morbidly obese. EYES: Pupils equal, round, reactive to light and accommodation. No scleral icterus. Extraocular muscles intact.  HEENT: Head atraumatic, normocephalic. Oropharynx and nasopharynx clear.  NECK:  Supple, no jugular venous distention. No thyroid enlargement, no tenderness.  LUNGS: Normal  breath sounds bilaterally, no wheezing, rales,rhonchi or crepitation. No use of accessory muscles of respiration.  CARDIOVASCULAR: S1, S2 normal. No murmurs, rubs, or gallops.  ABDOMEN: Soft, non-tender, non-distended. Bowel sounds present. No organomegaly or mass.  EXTREMITIES: No pedal edema, cyanosis, or clubbing.  NEUROLOGIC: Cranial nerves II through XII are intact. Muscle strength 5/5 in all extremities. Sensation intact. Gait not checked.  PSYCHIATRIC: The patient is alert and oriented x 3.  SKIN: No obvious rash, lesion, or ulcer.  DATA REVIEW:   CBC  Recent Labs Lab 12/19/16 0419  WBC 8.7  HGB 12.0*  HCT 33.9*  PLT 167    Chemistries   Recent Labs Lab 12/18/16 2028 12/19/16 0419  NA 137 139  K 5.3* 5.0  CL 107 110  CO2 19* 23  GLUCOSE 350* 379*  BUN 40* 45*  CREATININE 2.39* 2.28*  CALCIUM 7.9* 7.7*  AST 46*  --   ALT 30  --   ALKPHOS 105  --   BILITOT 0.7  --      Microbiology Results  No results found for this or any previous visit.  RADIOLOGY:  Dg Chest 1 View  Result Date: 12/18/2016 CLINICAL DATA:  61 year old male with choking sensation. EXAM: CHEST 1 VIEW COMPARISON:  None. FINDINGS: There is mild cardiomegaly with mild vascular congestion. No focal consolidation, pleural effusion, or pneumothorax. Left shoulder arthroplasty. No acute osseous pathology. IMPRESSION: Cardiomegaly with mild vascular congestion.  No focal consolidation. Electronically Signed   By: Elgie CollardArash  Radparvar M.D.   On: 12/18/2016 19:51     Management plans discussed with the patient, family and they are in agreement.  CODE STATUS: Full Code   TOTAL TIME TAKING CARE OF THIS PATIENT: 26 minutes.    Shaune Pollackhen, Orley Lawry M.D on 12/19/2016 at 12:23 PM  Between 7am to 6pm - Pager - [phone]  After 6pm go to [email or web address] - Social research officer, [email or web address] [email or web address]  [facility] [place] [facility]  Office  [phone]  CC: Primary care physician; Rafael BihariWalker, John B III, MD   Note: This dictation  was prepared with Dragon dictation along with smaller phrase technology. Any transcriptional errors that result from this process are unintentional.

## 2016-12-19 NOTE — Progress Notes (Signed)
Patient bs 341. MD notified. Sliding scale ordered.

## 2016-12-19 NOTE — Discharge Instructions (Signed)
Heart healthy and ADA diet. °Diet control. °

## 2016-12-19 NOTE — Progress Notes (Signed)
Patient is being discharged to home. IV removed, belongings packed. DC instructions given and patient acknowledged understanding. Family will come to pick up patient.

## 2016-12-20 ENCOUNTER — Encounter: Payer: Self-pay | Admitting: Internal Medicine

## 2016-12-20 LAB — HIV ANTIBODY (ROUTINE TESTING W REFLEX): HIV SCREEN 4TH GENERATION: NONREACTIVE

## 2017-03-17 ENCOUNTER — Ambulatory Visit (INDEPENDENT_AMBULATORY_CARE_PROVIDER_SITE_OTHER): Payer: BLUE CROSS/BLUE SHIELD | Admitting: Urology

## 2017-03-17 ENCOUNTER — Encounter: Payer: Self-pay | Admitting: Urology

## 2017-03-17 VITALS — BP 162/72 | HR 84 | Ht 66.0 in | Wt 298.5 lb

## 2017-03-17 DIAGNOSIS — M179 Osteoarthritis of knee, unspecified: Secondary | ICD-10-CM

## 2017-03-17 DIAGNOSIS — M19079 Primary osteoarthritis, unspecified ankle and foot: Secondary | ICD-10-CM | POA: Insufficient documentation

## 2017-03-17 DIAGNOSIS — N3943 Post-void dribbling: Secondary | ICD-10-CM

## 2017-03-17 DIAGNOSIS — Z87448 Personal history of other diseases of urinary system: Secondary | ICD-10-CM | POA: Diagnosis not present

## 2017-03-17 DIAGNOSIS — M1710 Unilateral primary osteoarthritis, unspecified knee: Secondary | ICD-10-CM

## 2017-03-17 HISTORY — DX: Unilateral primary osteoarthritis, unspecified knee: M17.10

## 2017-03-17 HISTORY — DX: Osteoarthritis of knee, unspecified: M17.9

## 2017-03-17 LAB — URINALYSIS, COMPLETE
Bilirubin, UA: NEGATIVE
KETONES UA: NEGATIVE
LEUKOCYTES UA: NEGATIVE
NITRITE UA: NEGATIVE
SPEC GRAV UA: 1.02 (ref 1.005–1.030)
Urobilinogen, Ur: 0.2 mg/dL (ref 0.2–1.0)
pH, UA: 6 (ref 5.0–7.5)

## 2017-03-17 LAB — MICROSCOPIC EXAMINATION: EPITHELIAL CELLS (NON RENAL): NONE SEEN /HPF (ref 0–10)

## 2017-03-17 MED ORDER — TAMSULOSIN HCL 0.4 MG PO CAPS: 0.4000 mg | ORAL_CAPSULE | Freq: Every day | ORAL | 3 refills | Status: AC

## 2017-03-17 NOTE — Progress Notes (Signed)
03/17/2017 9:57 AM   Ruben Ward 1956/02/29 [id number]  Referring provider: Rafael Bihari, MD [id number] [facility] [address] [facility] [place], [place] [address]  Chief Complaint  Patient presents with  . Urinary Incontinence    HPI: 61 y.o. male presents for evaluation of urinary incontinence.  I have seen him for several years for a history of urethral stricture disease.  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  He is performing twice weekly self-catheterization without problems.  He denies dysuria, gross hematuria or recent UTI.  He states he is voiding with good stream however has post void dribbling.  PSA 02/2016 was 0.34   PMH: Past Medical History:  Diagnosis Date  . AKI (acute kidney injury) (HCC) 11/29/2015  . Arthropathy of shoulder region 01/05/2012  . Asthma   . Back pain 12/26/2012  . 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 Cherylann Ratel), proteinuria  . Closed fracture of metatarsal bone 12/10/2016  . Complication of anesthesia   . Diabetes mellitus (HCC) 08/19/2016  . Diabetes mellitus without complication (HCC)   . Difficult intubation   . Endocarditis   . History of cardiovascular disorder 02/12/2014  . Hypercholesterolemia 08/19/2016  . Hyperlipemia   . Hypertension   . Incomplete emptying of bladder 09/30/2016  . Iron deficiency anemia 01/29/2014  . Knee pain 08/24/2016  . Morbid obesity (HCC) 02/12/2014  . Obstructive sleep apnea syndrome 08/19/2016  . Osteoarthritis of knee 03/17/2017  . Personal history of other diseases of the circulatory system 02/12/2014  . 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 Sleep apnea 02/12/2014   Overview:  Overview:  Overview:  on BiPAP on BiPAP  . Sprain of foot  12/27/2015  . 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 Umbilical hernia 08/24/2016  . Urethral stricture 08/12/2014   Overview:  IMOUPDATE    Surgical History: Past Surgical History:  Procedure Laterality Date  . JOINT REPLACEMENT    . KNEE ARTHROSCOPY Bilateral   . KNEE ARTHROSCOPY Left   . partial shoulder replacement Left 2013  . SHOULDER SURGERY    . WRIST ARTHROSCOPY Right     Home Medications:  Allergies as of 03/17/2017      Reactions   Glucophage [metformin]    Metformin And Related Other (See Comments)   Renal problems Doctor Solum said that kidney function was ok to take metformin currently   Metformin And Related    Renal failure      Medication List       Accurate as of 03/17/17  9:57 AM. Always use your most recent med list.          ACTOS PO Take 1 tablet by mouth daily.   amLODipine 10 MG tablet Commonly known as:  NORVASC Take 10 mg by mouth daily.   cetirizine 10 MG tablet Commonly known as:  ZYRTEC Take 10 mg by mouth daily.   docusate sodium 100 MG capsule Commonly known as:  COLACE Take 1 capsule (100 mg total) by mouth 2 (two) times daily.   enalapril 20 MG tablet Commonly known as:  VASOTEC Take 20 mg by mouth 2 (two) times daily.   hydrALAZINE 100 MG tablet Commonly known as:  APRESOLINE Take 100 mg  by mouth 2 (two) times daily.   labetalol 100 MG tablet Commonly known as:  NORMODYNE Take 1 tablet (100 mg total) by mouth 2 (two) times daily.   LASIX PO Take 1 tablet by mouth daily.   liraglutide 18 MG/3ML Sopn Commonly known as:  VICTOZA Inject 0.2 mLs (1.2 mg total) into the skin daily.   sodium bicarbonate 650 MG tablet Take 650 mg by mouth daily.   VELTASSA 16.8 g Pack Generic drug:  Patiromer Sorbitex Calcium MIX AND DIS 1 PACKET IN WATER AND DRINK PO ONCE D.       Allergies:  Allergies  Allergen Reactions  . Glucophage [Metformin]   . Metformin And Related Other (See Comments)    Renal  problems Doctor Solum said that kidney function was ok to take metformin currently  . Metformin And Related     Renal failure    Family History: Family History  Problem Relation Age of Onset  . Prostate cancer Father     Social History:  reports that he has never smoked. He has never used smokeless tobacco. He reports that he does not drink alcohol or use drugs.  ROS: UROLOGY Frequent Urination?: Yes Hard to postpone urination?: No Burning/pain with urination?: No Get up at night to urinate?: Yes Leakage of urine?: Yes Urine stream starts and stops?: No Trouble starting stream?: No Do you have to strain to urinate?: No Blood in urine?: No Urinary tract infection?: Yes Sexually transmitted disease?: No Injury to kidneys or bladder?: No Painful intercourse?: No Weak stream?: No Erection problems?: No Penile pain?: No  Gastrointestinal Nausea?: No Vomiting?: No Indigestion/heartburn?: No Diarrhea?: No Constipation?: No  Constitutional Fever: No Night sweats?: No Weight loss?: No Fatigue?: No  Skin Skin rash/lesions?: No Itching?: No  Eyes Blurred vision?: No Double vision?: No  Ears/Nose/Throat Sore throat?: No Sinus problems?: No  Hematologic/Lymphatic Swollen glands?: No Easy bruising?: No  Cardiovascular Leg swelling?: Yes Chest pain?: No  Respiratory Cough?: Yes Shortness of breath?: No  Endocrine Excessive thirst?: No  Musculoskeletal Back pain?: Yes Joint pain?: Yes  Neurological Headaches?: No Dizziness?: No  Psychologic Depression?: No Anxiety?: No  Physical Exam: BP (!) 162/72 (BP Location: Right Arm, Patient Position: Sitting, Cuff Size: Large)   Pulse 84   Ht 5\' 6"  (1.676 m)   Wt 298 lb 8 oz (135.4 kg)   BMI 48.18 kg/m   Constitutional:  Alert and oriented, No acute distress. HEENT: [place] AT, moist mucus membranes.  Trachea midline, no masses. Cardiovascular: No clubbing, cyanosis, or edema. Respiratory: Normal  respiratory effort, no increased work of breathing. GI: Abdomen is soft, nontender, nondistended, no abdominal masses GU: No CVA tenderness.  Skin: No rashes, bruises or suspicious lesions. Lymph: No cervical or inguinal adenopathy. Neurologic: Grossly intact, no focal deficits, moving all 4 extremities. Psychiatric: Normal mood and affect.  Laboratory Data: Lab Results  Component Value Date   WBC 8.7 12/19/2016   HGB 12.0 (L) 12/19/2016   HCT 33.9 (L) 12/19/2016   MCV 89.3 12/19/2016   PLT 167 12/19/2016    Lab Results  Component Value Date   CREATININE 2.28 (H) 12/19/2016     Lab Results  Component Value Date   HGBA1C 6.2 (H) 11/29/2015    Urinalysis Dipstick 2+ glucose/3+ protein/1+ blood.  Microscopy negative   Assessment & Plan:    1. Post-void dribbling He states his symptoms are bothersome.  Trial of tamsulosin 0.4 mg daily  - Urinalysis, Complete  2. History of  urethral stricture Continue intermittent catheterization.  Continue annual follow-up.    Riki Altes, MD  [facility] [address], [address] [place], [place] [address] [phone]

## 2017-03-23 ENCOUNTER — Encounter: Payer: Self-pay | Admitting: Urology

## 2017-03-30 ENCOUNTER — Telehealth: Payer: Self-pay | Admitting: Urology

## 2017-03-30 NOTE — Telephone Encounter (Signed)
Pt has 5 catheters.  He needs more.  Please call pt [phone]

## 2017-03-31 NOTE — Telephone Encounter (Signed)
LMOM

## 2017-04-01 NOTE — Telephone Encounter (Signed)
Spoke with pt in reference to needing more catheters. Pt stated that he will bring his current catheter by tomorrow and get samples to match what he has.

## 2017-04-20 ENCOUNTER — Ambulatory Visit
Admission: RE | Admit: 2017-04-20 | Discharge: 2017-04-20 | Disposition: A | Discharge status: Home / Self Care | Payer: BLUE CROSS/BLUE SHIELD | Source: Ambulatory Visit | Attending: Unknown Physician Specialty | Admitting: Unknown Physician Specialty

## 2017-04-20 ENCOUNTER — Other Ambulatory Visit: Payer: Self-pay | Admitting: Unknown Physician Specialty

## 2017-04-20 DIAGNOSIS — R059 Cough, unspecified: Secondary | ICD-10-CM

## 2017-04-20 DIAGNOSIS — R918 Other nonspecific abnormal finding of lung field: Secondary | ICD-10-CM | POA: Diagnosis not present

## 2017-04-20 DIAGNOSIS — R05 Cough: Secondary | ICD-10-CM

## 2017-05-31 ENCOUNTER — Other Ambulatory Visit: Payer: Self-pay | Admitting: Unknown Physician Specialty

## 2017-05-31 ENCOUNTER — Ambulatory Visit
Admission: RE | Admit: 2017-05-31 | Discharge: 2017-05-31 | Disposition: A | Discharge status: Home / Self Care | Payer: BLUE CROSS/BLUE SHIELD | Source: Ambulatory Visit | Attending: Unknown Physician Specialty | Admitting: Unknown Physician Specialty

## 2017-05-31 DIAGNOSIS — R059 Cough, unspecified: Secondary | ICD-10-CM

## 2017-05-31 DIAGNOSIS — I517 Cardiomegaly: Secondary | ICD-10-CM | POA: Insufficient documentation

## 2017-05-31 DIAGNOSIS — R05 Cough: Secondary | ICD-10-CM

## 2017-06-01 NOTE — Progress Notes (Signed)
[facility] [place] [facility] Medicine Consultation      Assessment and Plan:  Acute cough with bronchitis.  -Acute cough, status post recent episode of sinus infection. -Cough appears improved over the last few days.  I have given him prescription for Tessalon, and have asked him to use his current inhaler as needed for symptomatic relief of the cough. - He is asked to call us back in 1 month if the cough is not improving, we can consider a prednisone taper at that time.  Asthma-allergic.  -Patient has multiple allergies with history of asthma.  He tells me that his asthma is well controlled, he rarely uses his current inhalers other than for the current symptoms of cough. -Continue current management of asthma, including continued allergy shots.  OSA.  -Patient has a history of obstructive sleep apnea, he tells me that he is currently using CPAP every night, for the entire night. -He has no residual symptoms of excessive daytime sleepiness.  He is asked to call us back should he require any Assistance with sleep apnea issues.  Meds ordered this encounter  Medications  . benzonatate (TESSALON PERLES) 100 MG capsule    Sig: Take 2 capsules (200 mg total) by mouth 3 (three) times daily.    Dispense:  90 capsule    Refill:  1    Return if symptoms worsen or fail to improve.    Date: 06/02/2017  MRN# [id number] Ruben Ward 12-01-1955    Ruben Ward is a 62 y.o. old male seen in consultation for chief complaint of:    Chief Complaint  Patient presents with  . Advice Only    referred by Dr. Truddie Crumble WU:[id number], sob and cough.    HPI:   Patient is a 62 year old male with a history of hypertension, hyperlipidemia, stroke, obstructive sleep apnea. He started coughing about a week ago, started as a sinus infection. He also has asthma, diagnosed several  Years ago. His asthma has been well controlled, and does not feel limited by breathing.  He has inhalers, not sure  what, he feels that they are helpful. He feels that they help with the coughing.  He denies having been hospitalized for breathing problems.  He has undergone allergy testing and is currently getting shots.  He also takes a cough medicine, delsym.  He does not smoke.  He has reflux, and takes nothing as far as he knows.  He has occasional sinus drainage, He takes a nasal spray and zyrtec which help.  He is retired, he worked as Copy.   He is currently on CPAP for OSA, he has been on it for a few years now, it is working well, and he does not feel sleepy during the day.   Images personally reviewed, chest x-ray 05/31/17; normal lungs, cardiomegaly. Review and summary of outside records, patient has multiple environmental allergies including pigweed, cockroach, Maple   PMHX:   Past Medical History:  Diagnosis Date  . AKI (acute kidney injury) (HCC) 11/29/2015  . Arthropathy of shoulder region 01/05/2012  . Asthma   . Back pain 12/26/2012  . 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 Cherylann Ratel), proteinuria  . Closed fracture of metatarsal bone 12/10/2016  . Complication of anesthesia   . Diabetes mellitus (HCC) 08/19/2016  .  Diabetes mellitus without complication (HCC)   . Difficult intubation   . Endocarditis   . History of cardiovascular disorder 02/12/2014  . Hypercholesterolemia 08/19/2016  . Hyperlipemia   . Hypertension   . Incomplete emptying of bladder 09/30/2016  . Iron deficiency anemia 01/29/2014  . Knee pain 08/24/2016  . Morbid obesity (HCC) 02/12/2014  . Obstructive sleep apnea syndrome 08/19/2016  . Osteoarthritis of knee 03/17/2017  . Personal history of other diseases of the circulatory system 02/12/2014  . 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 Sleep apnea 02/12/2014   Overview:  Overview:  Overview:  on BiPAP on BiPAP  . Sprain of foot 12/27/2015  . 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 Umbilical hernia 08/24/2016  . Urethral stricture 08/12/2014   Overview:  IMOUPDATE   Surgical Hx:  Past Surgical History:  Procedure Laterality Date  . JOINT REPLACEMENT    . KNEE ARTHROSCOPY Bilateral   . KNEE ARTHROSCOPY Left   . partial shoulder replacement Left 2013  . SHOULDER SURGERY    . WRIST ARTHROSCOPY Right    Family Hx:  Family History  Problem Relation Age of Onset  . Prostate cancer Father    Social Hx:   Social History   Tobacco Use  . Smoking status: Never Smoker  . Smokeless tobacco: Never Used  Substance Use Topics  . Alcohol use: No  . Drug use: No   Medication:    Current Outpatient Medications:  .  amLODipine (NORVASC) 10 MG tablet, Take 10 mg by mouth daily., Disp: , Rfl:  .  cetirizine (ZYRTEC) 10 MG tablet, Take 10 mg by mouth daily., Disp: , Rfl:  .  docusate sodium (COLACE) 100 MG capsule, Take 1 capsule (100 mg total) by mouth 2 (two) times daily., Disp: 10 capsule, Rfl: 0 .  enalapril (VASOTEC) 20 MG tablet, Take 20 mg by mouth 2 (two) times daily., Disp: , Rfl:  .  Furosemide (LASIX PO), Take 1 tablet by mouth daily., Disp: , Rfl:  .  hydrALAZINE (APRESOLINE) 100 MG tablet, Take 100 mg by mouth 2 (two) times daily., Disp: , Rfl:  .  labetalol (NORMODYNE) 100 MG tablet, Take 1 tablet (100 mg total) by mouth 2 (two) times daily., Disp: , Rfl:  .  Liraglutide (VICTOZA) 18 MG/3ML SOPN, Inject 0.2 mLs (1.2 mg total) into the skin daily., Disp: 6 mL, Rfl:  .  Pioglitazone HCl (ACTOS PO), Take 1 tablet by mouth daily., Disp: , Rfl:  .  sodium bicarbonate 650 MG tablet, Take 650 mg by mouth daily., Disp: , Rfl:  .  tamsulosin (FLOMAX) 0.4 MG CAPS capsule, Take 1 capsule (0.4 mg total) by mouth daily., Disp: 30 capsule, Rfl: 3 .  VELTASSA 16.8 g PACK, MIX AND DIS 1 PACKET IN WATER AND DRINK PO  ONCE D., Disp: , Rfl: 3   Allergies:  Glucophage [metformin]; Metformin and related; and Metformin and related  Review of Systems: Gen:  Denies  fever, sweats, chills HEENT: Denies blurred vision, double vision. bleeds, sore throat Cvc:  No dizziness, chest pain. Resp:   Denies cough or sputum production, shortness of breath Gi: Denies swallowing difficulty, stomach pain. Gu:  Denies bladder incontinence, burning urine Ext:   No Joint pain, stiffness. Skin: No skin rash,  hives  Endoc:  No polyuria, polydipsia. Psych: No depression, insomnia. Other:  All other systems were reviewed with the patient and were negative other that what is mentioned in the HPI.  Physical Examination:   VS: BP 110/60 (BP Location: Left Arm, Cuff Size: Large)   Pulse 77   Resp 16   Ht 5\' 6"  (1.676 m)   Wt (!) 306 lb (138.8 kg)   SpO2 97%   BMI 49.39 kg/m   General Appearance: No distress  Neuro:without focal findings,  speech normal,  HEENT: PERRLA, EOM intact.   Pulmonary: normal breath sounds, No wheezing.  CardiovascularNormal S1,S2.  No m/r/g.   Abdomen: Benign, Soft, non-tender. Renal:  No costovertebral tenderness  GU:  No performed at this time. Endoc: No evident thyromegaly, no signs of acromegaly. Skin:   warm, no rashes, no ecchymosis  Extremities: normal, no cyanosis, clubbing.  Other findings:    LABORATORY PANEL:   CBC No results for input(s): WBC, HGB, HCT, PLT in the last 168 hours. ------------------------------------------------------------------------------------------------------------------  Chemistries  No results for input(s): NA, K, CL, CO2, GLUCOSE, BUN, CREATININE, CALCIUM, MG, AST, ALT, ALKPHOS, BILITOT in the last 168 hours.  Invalid input(s): GFRCGP ------------------------------------------------------------------------------------------------------------------  Cardiac Enzymes No results for input(s): TROPONINI in the last 168  hours. ------------------------------------------------------------  RADIOLOGY:  No results found.     Thank  you for the consultation and for allowing [facility] to assist in the care of your patient. Our recommendations are noted above.  Please contact us if we can be of further service.   Wells Guiles, MD.  Board Certified in Internal Medicine, Pulmonary Medicine, Critical Care Medicine, and Sleep Medicine.  [facility] Office Number: [phone]  Santiago Glad, M.D.  Billy Fischer, M.D  06/02/2017

## 2017-06-02 ENCOUNTER — Encounter: Payer: Self-pay | Admitting: Internal Medicine

## 2017-06-02 ENCOUNTER — Ambulatory Visit: Payer: BLUE CROSS/BLUE SHIELD | Admitting: Internal Medicine

## 2017-06-02 VITALS — BP 110/60 | HR 77 | Resp 16 | Ht 66.0 in | Wt 306.0 lb

## 2017-06-02 DIAGNOSIS — R05 Cough: Secondary | ICD-10-CM | POA: Diagnosis not present

## 2017-06-02 DIAGNOSIS — G4733 Obstructive sleep apnea (adult) (pediatric): Secondary | ICD-10-CM

## 2017-06-02 DIAGNOSIS — J209 Acute bronchitis, unspecified: Secondary | ICD-10-CM

## 2017-06-02 DIAGNOSIS — J440 Chronic obstructive pulmonary disease with acute lower respiratory infection: Secondary | ICD-10-CM | POA: Diagnosis not present

## 2017-06-02 DIAGNOSIS — R059 Cough, unspecified: Secondary | ICD-10-CM

## 2017-06-02 MED ORDER — BENZONATATE 100 MG PO CAPS: 200.0000 mg | ORAL_CAPSULE | Freq: Three times a day (TID) | ORAL | 1 refills | Status: AC

## 2017-06-02 NOTE — Patient Instructions (Signed)
--  use your inhaler and tessalon pills as needed for the cough, you can also use Delsym over the counter.   --Call back in 1 month if cough is not improving.

## 2017-09-15 ENCOUNTER — Encounter: Payer: Self-pay | Admitting: Emergency Medicine

## 2017-09-15 ENCOUNTER — Other Ambulatory Visit: Payer: Self-pay

## 2017-09-15 ENCOUNTER — Emergency Department
Admission: EM | Admit: 2017-09-15 | Discharge: 2017-09-15 | Disposition: A | Discharge status: Home / Self Care | Payer: BLUE CROSS/BLUE SHIELD | Attending: Emergency Medicine | Admitting: Emergency Medicine

## 2017-09-15 DIAGNOSIS — Z79899 Other long term (current) drug therapy: Secondary | ICD-10-CM | POA: Insufficient documentation

## 2017-09-15 DIAGNOSIS — I129 Hypertensive chronic kidney disease with stage 1 through stage 4 chronic kidney disease, or unspecified chronic kidney disease: Secondary | ICD-10-CM | POA: Insufficient documentation

## 2017-09-15 DIAGNOSIS — N189 Chronic kidney disease, unspecified: Secondary | ICD-10-CM

## 2017-09-15 DIAGNOSIS — E1165 Type 2 diabetes mellitus with hyperglycemia: Secondary | ICD-10-CM | POA: Diagnosis present

## 2017-09-15 DIAGNOSIS — N182 Chronic kidney disease, stage 2 (mild): Secondary | ICD-10-CM | POA: Diagnosis not present

## 2017-09-15 DIAGNOSIS — R739 Hyperglycemia, unspecified: Secondary | ICD-10-CM

## 2017-09-15 DIAGNOSIS — N179 Acute kidney failure, unspecified: Secondary | ICD-10-CM

## 2017-09-15 LAB — BASIC METABOLIC PANEL
Anion gap: 8 (ref 5–15)
Anion gap: 10 (ref 5–15)
BUN: 63 mg/dL — AB (ref 6–20)
BUN: 64 mg/dL — AB (ref 6–20)
CHLORIDE: 104 mmol/L (ref 101–111)
CO2: 18 mmol/L — AB (ref 22–32)
CO2: 19 mmol/L — AB (ref 22–32)
CREATININE: 3.28 mg/dL — AB (ref 0.61–1.24)
Calcium: 7.4 mg/dL — ABNORMAL LOW (ref 8.9–10.3)
Calcium: 7.6 mg/dL — ABNORMAL LOW (ref 8.9–10.3)
Chloride: 102 mmol/L (ref 101–111)
Creatinine, Ser: 2.96 mg/dL — ABNORMAL HIGH (ref 0.61–1.24)
GFR calc Af Amer: 22 mL/min — ABNORMAL LOW (ref >60)
GFR calc Af Amer: 25 mL/min — ABNORMAL LOW (ref >60)
GFR calc non Af Amer: 19 mL/min — ABNORMAL LOW (ref >60)
GFR calc non Af Amer: 21 mL/min — ABNORMAL LOW (ref >60)
GLUCOSE: 426 mg/dL — AB (ref 65–99)
Glucose, Bld: 545 mg/dL (ref 65–99)
POTASSIUM: 4.6 mmol/L (ref 3.5–5.1)
Potassium: 4.7 mmol/L (ref 3.5–5.1)
SODIUM: 128 mmol/L — AB (ref 135–145)
SODIUM: 133 mmol/L — AB (ref 135–145)

## 2017-09-15 LAB — URINALYSIS, COMPLETE (UACMP) WITH MICROSCOPIC
Bacteria, UA: NONE SEEN
Bilirubin Urine: NEGATIVE
Glucose, UA: >=500 mg/dL — AB
Hgb urine dipstick: NEGATIVE
Ketones, ur: NEGATIVE mg/dL
Leukocytes, UA: NEGATIVE
Nitrite: NEGATIVE
Protein, ur: 100 mg/dL — AB
SPECIFIC GRAVITY, URINE: 1.013 (ref 1.005–1.030)
pH: 5 (ref 5.0–8.0)

## 2017-09-15 LAB — CBC
HCT: 35.6 % — ABNORMAL LOW (ref 40.0–52.0)
Hemoglobin: 12.1 g/dL — ABNORMAL LOW (ref 13.0–18.0)
MCH: 30.1 pg (ref 26.0–34.0)
MCHC: 34.1 g/dL (ref 32.0–36.0)
MCV: 88.4 fL (ref 80.0–100.0)
PLATELETS: 225 10*3/uL (ref 150–440)
RBC: 4.02 MIL/uL — ABNORMAL LOW (ref 4.40–5.90)
RDW: 14.3 % (ref 11.5–14.5)
WBC: 12.8 10*3/uL — ABNORMAL HIGH (ref 3.8–10.6)

## 2017-09-15 LAB — GLUCOSE, CAPILLARY: Glucose-Capillary: 473 mg/dL — ABNORMAL HIGH (ref 65–99)

## 2017-09-15 MED ORDER — SODIUM CHLORIDE 0.9 % IV BOLUS
1000.0000 mL | Freq: Once | INTRAVENOUS | Status: AC
  Administered 2017-09-15: 1000 mL via INTRAVENOUS

## 2017-09-15 MED ORDER — INSULIN ASPART 100 UNIT/ML ~~LOC~~ SOLN
5.0000 [IU] | Freq: Once | SUBCUTANEOUS | Status: AC
  Administered 2017-09-15: 5 [IU] via SUBCUTANEOUS
  Filled 2017-09-15: qty 1

## 2017-09-15 MED ORDER — INSULIN ASPART 100 UNIT/ML ~~LOC~~ SOLN
5.0000 [IU] | Freq: Once | SUBCUTANEOUS | Status: AC
  Administered 2017-09-15: 5 [IU] via INTRAVENOUS
  Filled 2017-09-15: qty 1

## 2017-09-15 NOTE — ED Triage Notes (Signed)
Pt presents to ED via POV with c/o abnormal labs. Pt states was sent by kidney doctor for "blood sugar being high and kidney function being low". Pt is alert and orietned on arrival.

## 2017-09-15 NOTE — ED Provider Notes (Signed)
[facility] Emergency Department Provider Note  ___________________________________________   First MD Initiated Contact with Patient 09/15/17 1502     (approximate)  I have reviewed the triage vital signs and the nursing notes.   HISTORY  Chief Complaint Abnormal Lab   HPI Ruben Ward is a 62 y.o. male with a history of chronic kidney disease as well as diabetes who was sent into the emergency department for hyperglycemia as well as elevated creatinine.  The patient says that he has no complaints but was just at a maintenance nephrology checkup this morning when his lab values were found to be abnormal.  He was sent to the emergency department for further evaluation and treatment.  He says that he takes his insulin inconsistently and this is likely the reason for his hyperglycemia.  He understands that if he were more reliable with his insulin that this would likely result in his glucose being in a more normal range.  Past Medical History:  Diagnosis Date  . AKI (acute kidney injury) (HCC) 11/29/2015  . Arthropathy of shoulder region 01/05/2012  . Asthma   . Back pain 12/26/2012  . 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 Cherylann Ratel), proteinuria  . Closed fracture of metatarsal bone 12/10/2016  . Complication of anesthesia   . Diabetes mellitus (HCC) 08/19/2016  . Diabetes mellitus without complication (HCC)   . Difficult intubation   . Endocarditis   . History of cardiovascular disorder 02/12/2014  . Hypercholesterolemia 08/19/2016  . Hyperlipemia   . Hypertension   . Incomplete emptying of bladder 09/30/2016  . Iron deficiency anemia 01/29/2014  . Knee pain 08/24/2016  . Morbid obesity (HCC) 02/12/2014  . Obstructive sleep apnea syndrome  08/19/2016  . Osteoarthritis of knee 03/17/2017  . Personal history of other diseases of the circulatory system 02/12/2014  . 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 Sleep apnea 02/12/2014   Overview:  Overview:  Overview:  on BiPAP on BiPAP  . Sprain of foot 12/27/2015  . 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 Umbilical hernia 08/24/2016  . Urethral stricture 08/12/2014   Overview:  IMOUPDATE    Patient Active Problem List   Diagnosis Date Noted  . Osteoarthritis of foot joint 03/17/2017  . Osteoarthritis of knee 03/17/2017  . Post-void dribbling 03/17/2017  . Choking due to food (regurgitated), subsequent encounter 12/18/2016  . Closed fracture of metatarsal bone 12/10/2016  . Incomplete emptying of bladder 09/30/2016  . Knee pain 08/24/2016  . Umbilical hernia 08/24/2016  . Diabetes mellitus (HCC) 08/19/2016  . Hypercholesterolemia 08/19/2016  . Obstructive sleep apnea syndrome 08/19/2016  . Sprain of foot 12/27/2015  . AKI (acute kidney injury) (HCC) 11/29/2015  . 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 History of urethral stricture 08/12/2014  . Chronic kidney disease, stage II (mild) 02/12/2014  . History of cardiovascular disorder 02/12/2014  . Morbid obesity (HCC) 02/12/2014  . Personal history of other diseases of the circulatory system 02/12/2014  . Sleep apnea 02/12/2014  . Bacterial endocarditis 01/29/2014  . Benign essential hypertension 01/29/2014  . Iron deficiency anemia 01/29/2014  . Back pain 12/26/2012  . Arthropathy of shoulder region 01/05/2012  . Primary localized osteoarthrosis of shoulder region 10/30/2011    Past  Surgical History:  Procedure Laterality Date  . JOINT REPLACEMENT    . KNEE ARTHROSCOPY Bilateral   . KNEE ARTHROSCOPY Left   . partial shoulder replacement Left 2013  . SHOULDER SURGERY    . WRIST ARTHROSCOPY Right     Prior to Admission medications   Medication Sig Start Date End Date  Taking? Authorizing Provider  amLODipine (NORVASC) 10 MG tablet Take 10 mg by mouth daily.    [provider]  benzonatate (TESSALON PERLES) 100 MG capsule Take 2 capsules (200 mg total) by mouth 3 (three) times daily. 06/02/17 06/02/18  Shane Crutch, MD  cetirizine (ZYRTEC) 10 MG tablet Take 10 mg by mouth daily.    [provider]  docusate sodium (COLACE) 100 MG capsule Take 1 capsule (100 mg total) by mouth 2 (two) times daily. 12/02/15   Auburn Bilberry, MD  enalapril (VASOTEC) 20 MG tablet Take 20 mg by mouth 2 (two) times daily.    [provider]  Furosemide (LASIX PO) Take 1 tablet by mouth daily.    [provider]  hydrALAZINE (APRESOLINE) 100 MG tablet Take 100 mg by mouth 2 (two) times daily.    [provider]  labetalol (NORMODYNE) 100 MG tablet Take 1 tablet (100 mg total) by mouth 2 (two) times daily. Patient not taking: Reported on 09/15/2017 12/02/15   Auburn Bilberry, MD  Liraglutide (VICTOZA) 18 MG/3ML SOPN Inject 0.2 mLs (1.2 mg total) into the skin daily. 12/02/15   Auburn Bilberry, MD  Pioglitazone HCl (ACTOS PO) Take 1 tablet by mouth daily.    [provider]  sodium bicarbonate 650 MG tablet Take 650 mg by mouth daily.    [provider]  tamsulosin (FLOMAX) 0.4 MG CAPS capsule Take 1 capsule (0.4 mg total) by mouth daily. 03/17/17   Stoioff, Verna Czech, MD  VELTASSA 16.8 g PACK MIX AND DIS 1 PACKET IN WATER AND DRINK PO ONCE D. 12/15/16   [provider]    Allergies Glucophage [metformin]; Metformin and related; and Metformin and related  Family History  Problem Relation Age of Onset  . Prostate cancer Father     Social History Social History   Tobacco Use  . Smoking status: Never Smoker  . Smokeless tobacco: Never Used  Substance Use Topics  . Alcohol use: No  . Drug use: No    Review of Systems  Constitutional: No fever/chills Eyes: No visual changes. ENT: No sore  throat. Cardiovascular: Denies chest pain. Respiratory: Denies shortness of breath. Gastrointestinal: No abdominal pain.  No nausea, no vomiting.  No diarrhea.  No constipation. Genitourinary: Negative for dysuria. Musculoskeletal: Negative for back pain. Skin: Negative for rash. Neurological: Negative for headaches, focal weakness or numbness.   ____________________________________________   PHYSICAL EXAM:  VITAL SIGNS: ED Triage Vitals  Enc Vitals Group     BP 09/15/17 1318 (!) 165/49     Pulse Rate 09/15/17 1318 96     Resp 09/15/17 1318 20     Temp 09/15/17 1318 98.6 F (37 C)     Temp Source 09/15/17 1318 Oral     SpO2 09/15/17 1318 96 %     Weight 09/15/17 1319 (!) 314 lb (142.4 kg)     Height 09/15/17 1319  (1.676 m)     Head Circumference --      Peak Flow --      Pain Score 09/15/17 1320 0     Pain Loc --  Pain Edu? --      Excl. in GC? --     Constitutional: Alert and oriented. Well appearing and in no acute distress. Eyes: Conjunctivae are normal.  Head: Atraumatic. Nose: No congestion/rhinnorhea. Mouth/Throat: Mucous membranes are moist.  Neck: No stridor.   Cardiovascular: Normal rate, regular rhythm. Grossly normal heart sounds.   Respiratory: Normal respiratory effort.  No retractions. Lungs CTAB. Gastrointestinal: Soft and nontender. No distention. Musculoskeletal: No lower extremity tenderness nor edema.  No joint effusions. Neurologic:  Normal speech and language. No gross focal neurologic deficits are appreciated. Skin:  Skin is warm, dry and intact. No rash noted. Psychiatric: Mood and affect are normal. Speech and behavior are normal.  ____________________________________________   LABS (all labs ordered are listed, but only abnormal results are displayed)  Labs Reviewed  BASIC METABOLIC PANEL - Abnormal; Notable for the following components:      Result Value   Sodium 128 (*)    CO2 18 (*)    Glucose, Bld 545 (*)    BUN 63 (*)     Creatinine, Ser 3.28 (*)    Calcium 7.4 (*)    GFR calc non Af Amer 19 (*)    GFR calc Af Amer 22 (*)    All other components within normal limits  CBC - Abnormal; Notable for the following components:   WBC 12.8 (*)    RBC 4.02 (*)    Hemoglobin 12.1 (*)    HCT 35.6 (*)    All other components within normal limits  URINALYSIS, COMPLETE (UACMP) WITH MICROSCOPIC - Abnormal; Notable for the following components:   Color, Urine YELLOW (*)    APPearance HAZY (*)    Glucose, UA >=500 (*)    Protein, ur 100 (*)    All other components within normal limits  GLUCOSE, CAPILLARY - Abnormal; Notable for the following components:   Glucose-Capillary 473 (*)    All other components within normal limits  BASIC METABOLIC PANEL  CBG MONITORING, ED   ____________________________________________  EKG   ____________________________________________  RADIOLOGY   ____________________________________________   PROCEDURES  Procedure(s) performed:   Procedures  Critical Care performed:   ____________________________________________   INITIAL IMPRESSION / ASSESSMENT AND PLAN / ED COURSE  Pertinent labs & imaging results that were available during my care of the patient were reviewed by me and considered in my medical decision making (see chart for details).  DDX: Hyperglycemia, DKA, dehydration, renal failure, hypoglycemia, hyperkalemia, medication noncompliance As part of my medical decision making, I reviewed the following data within the electronic MEDICAL RECORD NUMBER Old chart reviewed  ----------------------------------------- 4:26 PM on 09/15/2017 -----------------------------------------  Discussed the case with Dr. Thedore Mins who does not believe the patient needs to be admitted as long as the patient is asymptomatic which she is.  We will rehydrate the patient as well as give him insulin in order to work towards normalization of his glucose.  We will repeat a BMP as well.   Patient with normal anion gap however his bicarbonate level is slightly low at 18.  ----------------------------------------- 6:40 PM on 09/15/2017 -----------------------------------------  Patient continues to be asymptomatic.  Glucose now down to 426.  Kidney function improved with creatinine from 3.28 now 2.96.  We will give an additional dose of subcutaneous, 5 mg of insulin aspart.  The patient says he is following up with his primary care doctor tomorrow.  He also knows to take his insulin at home and be consistent with this.  Patient to  be discharged at this time. ____________________________________________   FINAL CLINICAL IMPRESSION(S) / ED DIAGNOSES  Acute on chronic kidney injury.  Hyperglycemia.    NEW MEDICATIONS STARTED DURING THIS VISIT:  New Prescriptions   No medications on file     Note:  This document was prepared using Dragon voice recognition software and may include unintentional dictation errors.     Myrna Blazer, MD 09/15/17 [phone]

## 2017-09-15 NOTE — ED Notes (Signed)
Pt states labs were drawn on Monday. CBG today 473.

## 2017-09-15 NOTE — ED Triage Notes (Signed)
Pt sent by [facility] for abnormal labs. Clinic reports a glucose of 606 and a kidney function of 19%

## 2017-09-16 ENCOUNTER — Inpatient Hospital Stay
Admission: AD | Admit: 2017-09-16 | Discharge: 2017-09-18 | DRG: 638 | Disposition: A | Discharge status: Home / Self Care | Payer: Medicare Other | Source: Ambulatory Visit | Attending: Internal Medicine | Admitting: Internal Medicine

## 2017-09-16 ENCOUNTER — Other Ambulatory Visit: Payer: Self-pay

## 2017-09-16 DIAGNOSIS — G4733 Obstructive sleep apnea (adult) (pediatric): Secondary | ICD-10-CM | POA: Diagnosis present

## 2017-09-16 DIAGNOSIS — N184 Chronic kidney disease, stage 4 (severe): Secondary | ICD-10-CM | POA: Diagnosis present

## 2017-09-16 DIAGNOSIS — Z6841 Body Mass Index (BMI) 40.0 and over, adult: Secondary | ICD-10-CM

## 2017-09-16 DIAGNOSIS — J45909 Unspecified asthma, uncomplicated: Secondary | ICD-10-CM | POA: Diagnosis present

## 2017-09-16 DIAGNOSIS — Z96612 Presence of left artificial shoulder joint: Secondary | ICD-10-CM | POA: Diagnosis present

## 2017-09-16 DIAGNOSIS — I129 Hypertensive chronic kidney disease with stage 1 through stage 4 chronic kidney disease, or unspecified chronic kidney disease: Secondary | ICD-10-CM | POA: Diagnosis present

## 2017-09-16 DIAGNOSIS — E785 Hyperlipidemia, unspecified: Secondary | ICD-10-CM | POA: Diagnosis present

## 2017-09-16 DIAGNOSIS — R739 Hyperglycemia, unspecified: Secondary | ICD-10-CM | POA: Diagnosis present

## 2017-09-16 DIAGNOSIS — E1122 Type 2 diabetes mellitus with diabetic chronic kidney disease: Secondary | ICD-10-CM | POA: Diagnosis present

## 2017-09-16 DIAGNOSIS — Z8673 Personal history of transient ischemic attack (TIA), and cerebral infarction without residual deficits: Secondary | ICD-10-CM | POA: Diagnosis not present

## 2017-09-16 DIAGNOSIS — E1165 Type 2 diabetes mellitus with hyperglycemia: Principal | ICD-10-CM | POA: Diagnosis present

## 2017-09-16 DIAGNOSIS — E1121 Type 2 diabetes mellitus with diabetic nephropathy: Secondary | ICD-10-CM | POA: Diagnosis present

## 2017-09-16 DIAGNOSIS — E11649 Type 2 diabetes mellitus with hypoglycemia without coma: Secondary | ICD-10-CM | POA: Diagnosis not present

## 2017-09-16 DIAGNOSIS — Z7984 Long term (current) use of oral hypoglycemic drugs: Secondary | ICD-10-CM

## 2017-09-16 DIAGNOSIS — Z79899 Other long term (current) drug therapy: Secondary | ICD-10-CM

## 2017-09-16 LAB — COMPREHENSIVE METABOLIC PANEL
ALT: 34 U/L (ref 17–63)
AST: 33 U/L (ref 15–41)
Albumin: 2.6 g/dL — ABNORMAL LOW (ref 3.5–5.0)
Alkaline Phosphatase: 119 U/L (ref 38–126)
Anion gap: 10 (ref 5–15)
BUN: 70 mg/dL — ABNORMAL HIGH (ref 6–20)
CHLORIDE: 102 mmol/L (ref 101–111)
CO2: 18 mmol/L — AB (ref 22–32)
Calcium: 7.5 mg/dL — ABNORMAL LOW (ref 8.9–10.3)
Creatinine, Ser: 3.14 mg/dL — ABNORMAL HIGH (ref 0.61–1.24)
GFR, EST AFRICAN AMERICAN: 23 mL/min — AB (ref >60)
GFR, EST NON AFRICAN AMERICAN: 20 mL/min — AB (ref >60)
Glucose, Bld: 530 mg/dL (ref 65–99)
POTASSIUM: 4.8 mmol/L (ref 3.5–5.1)
SODIUM: 130 mmol/L — AB (ref 135–145)
Total Bilirubin: 0.4 mg/dL (ref 0.3–1.2)
Total Protein: 5.7 g/dL — ABNORMAL LOW (ref 6.5–8.1)

## 2017-09-16 LAB — CBC WITH DIFFERENTIAL/PLATELET
BASOS ABS: 0 10*3/uL (ref 0–0.1)
Basophils Relative: 0 %
EOS ABS: 0 10*3/uL (ref 0–0.7)
EOS PCT: 0 %
HCT: 36.7 % — ABNORMAL LOW (ref 40.0–52.0)
Hemoglobin: 12.3 g/dL — ABNORMAL LOW (ref 13.0–18.0)
LYMPHS PCT: 4 %
Lymphs Abs: 0.5 10*3/uL — ABNORMAL LOW (ref 1.0–3.6)
MCH: 29.9 pg (ref 26.0–34.0)
MCHC: 33.5 g/dL (ref 32.0–36.0)
MCV: 89.2 fL (ref 80.0–100.0)
Monocytes Absolute: 0.3 10*3/uL (ref 0.2–1.0)
Monocytes Relative: 2 %
Neutro Abs: 10.3 10*3/uL — ABNORMAL HIGH (ref 1.4–6.5)
Neutrophils Relative %: 94 %
PLATELETS: 249 10*3/uL (ref 150–440)
RBC: 4.12 MIL/uL — AB (ref 4.40–5.90)
RDW: 14.4 % (ref 11.5–14.5)
WBC: 11.1 10*3/uL — AB (ref 3.8–10.6)

## 2017-09-16 LAB — GLUCOSE, CAPILLARY
GLUCOSE-CAPILLARY: 488 mg/dL — AB (ref 65–99)
GLUCOSE-CAPILLARY: 409 mg/dL — AB (ref 65–99)

## 2017-09-16 MED ORDER — SODIUM CHLORIDE 0.9 % IV BOLUS
1000.0000 mL | Freq: Once | INTRAVENOUS | Status: AC
  Administered 2017-09-16: 19:00:00 1000 mL via INTRAVENOUS

## 2017-09-16 MED ORDER — INSULIN GLARGINE 100 UNIT/ML ~~LOC~~ SOLN
26.0000 [IU] | Freq: Every day | SUBCUTANEOUS | Status: DC
  Administered 2017-09-16: 20:00:00 26 [IU] via SUBCUTANEOUS
  Filled 2017-09-16 (×2): qty 0.26

## 2017-09-16 MED ORDER — INSULIN ASPART 100 UNIT/ML ~~LOC~~ SOLN
0.0000 [IU] | Freq: Every day | SUBCUTANEOUS | Status: DC
  Filled 2017-09-16: qty 1

## 2017-09-16 MED ORDER — HEPARIN SODIUM (PORCINE) 5000 UNIT/ML IJ SOLN
5000.0000 [IU] | Freq: Three times a day (TID) | INTRAMUSCULAR | Status: DC
  Administered 2017-09-16 – 2017-09-18 (×5): 5000 [IU] via SUBCUTANEOUS
  Filled 2017-09-16 (×5): qty 1

## 2017-09-16 MED ORDER — SODIUM CHLORIDE 0.9% FLUSH
3.0000 mL | Freq: Two times a day (BID) | INTRAVENOUS | Status: DC
  Administered 2017-09-16 – 2017-09-18 (×4): 3 mL via INTRAVENOUS

## 2017-09-16 MED ORDER — INSULIN ASPART 100 UNIT/ML IV SOLN
10.0000 [IU] | Freq: Once | INTRAVENOUS | Status: AC
  Administered 2017-09-16: 10 [IU] via INTRAVENOUS
  Filled 2017-09-16: qty 0.1

## 2017-09-16 MED ORDER — ALBUTEROL SULFATE (2.5 MG/3ML) 0.083% IN NEBU: 2.5000 mg | INHALATION_SOLUTION | RESPIRATORY_TRACT | Status: DC | PRN

## 2017-09-16 MED ORDER — INSULIN ASPART 100 UNIT/ML ~~LOC~~ SOLN
0.0000 [IU] | Freq: Three times a day (TID) | SUBCUTANEOUS | Status: DC
  Administered 2017-09-16: 20:00:00 15 [IU] via SUBCUTANEOUS

## 2017-09-16 MED ORDER — ONDANSETRON HCL 4 MG PO TABS: 4.0000 mg | ORAL_TABLET | Freq: Four times a day (QID) | ORAL | Status: DC | PRN

## 2017-09-16 MED ORDER — POLYETHYLENE GLYCOL 3350 17 G PO PACK: 17.0000 g | PACK | Freq: Every day | ORAL | Status: DC | PRN

## 2017-09-16 MED ORDER — INSULIN ASPART 100 UNIT/ML ~~LOC~~ SOLN
8.0000 [IU] | Freq: Once | SUBCUTANEOUS | Status: AC
  Administered 2017-09-16: 23:00:00 8 [IU] via SUBCUTANEOUS
  Filled 2017-09-16: qty 1

## 2017-09-16 MED ORDER — ONDANSETRON HCL 4 MG/2ML IJ SOLN: 4.0000 mg | Freq: Four times a day (QID) | INTRAMUSCULAR | Status: DC | PRN

## 2017-09-16 NOTE — Progress Notes (Signed)
Pt BS 488. MD made aware. Pt to receive 25 units of insulin per Dr. Luberta Mutter.

## 2017-09-16 NOTE — H&P (Signed)
[employer] -  at [facility] [employer]   PATIENT NAME: Ruben Ward    MR#:  [id number]  DATE OF BIRTH:  01-29-1956  DATE OF ADMISSION:  09/16/2017  PRIMARY CARE PHYSICIAN: Gracelyn Nurse, MD   REQUESTING/REFERRING PHYSICIAN: Dr. Joneen Boers  CHIEF COMPLAINT:  No chief complaint on file.  Hyperglycemia  HISTORY OF PRESENT ILLNESS:  Ruben Ward  is a 62 y.o. male with a known history of DM, HTN, CKD4, CVA, Obesity here as direct admission from his PCP's office due to hyperglycemia.  Patient has noticed increased thirst and polyuria.  Was seen in the emergency room yesterday with blood sugars greater than 500.  Was given insulin and blood sugars improved to 400 range and discharged home. Today continues to have blood sugars greater than 500  in spite of using his home insulin. He doesn't know how much insulin to take. He says he turns the dial and takes the shot.  PAST MEDICAL HISTORY:   Past Medical History:  Diagnosis Date  . AKI (acute kidney injury) (HCC) 11/29/2015  . Arthropathy of shoulder region 01/05/2012  . Asthma   . Back pain 12/26/2012  . 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 Cherylann Ratel), proteinuria  . Closed fracture of metatarsal bone 12/10/2016  . Complication of anesthesia   . Diabetes mellitus (HCC) 08/19/2016  . Diabetes mellitus without complication (HCC)   . Difficult intubation   . Endocarditis   . History of cardiovascular disorder 02/12/2014  . Hypercholesterolemia 08/19/2016  . Hyperlipemia   . Hypertension   . Incomplete emptying of bladder 09/30/2016  . Iron deficiency anemia 01/29/2014  . Knee pain 08/24/2016  . Morbid obesity (HCC) 02/12/2014  . Obstructive sleep apnea syndrome 08/19/2016  . Osteoarthritis of knee  03/17/2017  . Personal history of other diseases of the circulatory system 02/12/2014  . 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 Sleep apnea 02/12/2014   Overview:  Overview:  Overview:  on BiPAP on BiPAP  . Sprain of foot 12/27/2015  . 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 Umbilical hernia 08/24/2016  . Urethral stricture 08/12/2014   Overview:  IMOUPDATE    PAST SURGICAL HISTORY:   Past Surgical History:  Procedure Laterality Date  . JOINT REPLACEMENT    . KNEE ARTHROSCOPY Bilateral   . KNEE ARTHROSCOPY Left   . partial shoulder replacement Left 2013  . SHOULDER SURGERY    . WRIST ARTHROSCOPY Right     SOCIAL HISTORY:   Social History   Tobacco Use  . Smoking status: Never Smoker  . Smokeless tobacco: Never Used  Substance Use Topics  . Alcohol use: No    FAMILY HISTORY:   Family History  Problem Relation Age of Onset  . Prostate cancer Father     DRUG ALLERGIES:   Allergies  Allergen Reactions  . Glucophage [Metformin]   . Metformin And Related Other (See Comments)    Renal problems Doctor Solum said that kidney function was ok to take metformin currently  . Metformin And Related     Renal failure    REVIEW OF SYSTEMS:   Review of Systems  Constitutional: Positive for malaise/fatigue. Negative for chills and fever.  HENT: Negative for sore throat.   Eyes: Negative for  blurred vision, double vision and pain.  Respiratory: Negative for cough, hemoptysis, shortness of breath and wheezing.   Cardiovascular: Negative for chest pain, palpitations, orthopnea and leg swelling.  Gastrointestinal: Negative for abdominal pain, constipation, diarrhea, heartburn, nausea and vomiting.  Genitourinary: Negative for dysuria and hematuria.  Musculoskeletal: Negative for back pain and joint pain.  Skin: Negative for rash.  Neurological: Negative for sensory change, speech change, focal weakness and headaches.  Endo/Heme/Allergies:  Does not bruise/bleed easily.  Psychiatric/Behavioral: Negative for depression. The patient is not nervous/anxious.     MEDICATIONS AT HOME:   Prior to Admission medications   Medication Sig Start Date End Date Taking? Authorizing Provider  amLODipine (NORVASC) 10 MG tablet Take 10 mg by mouth daily.    [provider]  benzonatate (TESSALON PERLES) 100 MG capsule Take 2 capsules (200 mg total) by mouth 3 (three) times daily. 06/02/17 06/02/18  Shane Crutch, MD  cetirizine (ZYRTEC) 10 MG tablet Take 10 mg by mouth daily.    [provider]  docusate sodium (COLACE) 100 MG capsule Take 1 capsule (100 mg total) by mouth 2 (two) times daily. 12/02/15   Auburn Bilberry, MD  enalapril (VASOTEC) 20 MG tablet Take 20 mg by mouth 2 (two) times daily.    [provider]  Furosemide (LASIX PO) Take 1 tablet by mouth daily.    [provider]  hydrALAZINE (APRESOLINE) 100 MG tablet Take 100 mg by mouth 2 (two) times daily.    [provider]  labetalol (NORMODYNE) 100 MG tablet Take 1 tablet (100 mg total) by mouth 2 (two) times daily. Patient not taking: Reported on 09/15/2017 12/02/15   Auburn Bilberry, MD  Liraglutide (VICTOZA) 18 MG/3ML SOPN Inject 0.2 mLs (1.2 mg total) into the skin daily. 12/02/15   Auburn Bilberry, MD  Pioglitazone HCl (ACTOS PO) Take 1 tablet by mouth daily.    [provider]  sodium bicarbonate 650 MG tablet Take 650 mg by mouth daily.    [provider]  tamsulosin (FLOMAX) 0.4 MG CAPS capsule Take 1 capsule (0.4 mg total) by mouth daily. 03/17/17   Stoioff, Verna Czech, MD  VELTASSA 16.8 g PACK MIX AND DIS 1 PACKET IN WATER AND DRINK PO ONCE D. 12/15/16   [provider]     VITAL SIGNS:  There were no vitals taken for this visit.  PHYSICAL EXAMINATION:  Physical Exam  GENERAL:  62 y.o.-year-old patient lying in the bed with no acute distress. Obese EYES: Pupils equal, round, reactive to light and  accommodation. No scleral icterus. Extraocular muscles intact.  HEENT: Head atraumatic, normocephalic. Oropharynx and nasopharynx clear. No oropharyngeal erythema, moist oral mucosa  NECK:  Supple, no jugular venous distention. No thyroid enlargement, no tenderness.  LUNGS: Normal breath sounds bilaterally, no wheezing, rales, rhonchi. No use of accessory muscles of respiration.  CARDIOVASCULAR: S1, S2 normal. No murmurs, rubs, or gallops.  ABDOMEN: Soft, nontender, nondistended. Bowel sounds present. No organomegaly or mass.  EXTREMITIES: No pedal edema, cyanosis, or clubbing. + 2 pedal & radial pulses b/l.   NEUROLOGIC: Cranial nerves II through XII are intact. No focal Motor or sensory deficits appreciated b/l PSYCHIATRIC: The patient is alert and oriented x 3. Good affect.  SKIN: No obvious rash, lesion, or ulcer.   LABORATORY PANEL:   CBC Recent Labs  Lab 09/15/17 1326  WBC 12.8*  HGB 12.1*  HCT 35.6*  PLT 225   ------------------------------------------------------------------------------------------------------------------  Chemistries  Recent Labs  Lab 09/15/17 1721  NA 133*  K 4.6  CL 104  CO2 19*  GLUCOSE 426*  BUN 64*  CREATININE 2.96*  CALCIUM 7.6*   ------------------------------------------------------------------------------------------------------------------  Cardiac Enzymes No results for input(s): TROPONINI in the last 168 hours. ------------------------------------------------------------------------------------------------------------------  RADIOLOGY:  No results found.   IMPRESSION AND PLAN:   * Hyperglycemia Will give IV insulin x 1 SSI and start lantus Check Hb A1c IVF  * CKD4 Monitor  * HTN Continue home meds  * DVT prophylaxis Heparin SQ  All the records are reviewed and case discussed with ED provider. Management plans discussed with the patient, family and they are in agreement.  CODE STATUS: FULL CODE  TOTAL CC TIME  TAKING CARE OF THIS PATIENT: 40 minutes.   Molinda Bailiff Lucky Trotta M.D on 09/16/2017 at 5:29 PM  Between 7am to 6pm - Pager - [phone]  After 6pm go to [email or web address] - password [email or web address] [facility]  [facility]  Office  [phone]  CC: Primary care physician; Gracelyn Nurse, MD  Note: This dictation was prepared with Dragon dictation along with smaller phrase technology. Any transcriptional errors that result from this process are unintentional.

## 2017-09-17 LAB — GLUCOSE, CAPILLARY
GLUCOSE-CAPILLARY: 50 mg/dL — AB (ref 65–99)
GLUCOSE-CAPILLARY: 150 mg/dL — AB (ref 65–99)
Glucose-Capillary: 103 mg/dL — ABNORMAL HIGH (ref 65–99)
Glucose-Capillary: 127 mg/dL — ABNORMAL HIGH (ref 65–99)
Glucose-Capillary: 74 mg/dL (ref 65–99)

## 2017-09-17 LAB — CBC
HEMATOCRIT: 33.8 % — AB (ref 40.0–52.0)
HEMOGLOBIN: 11.7 g/dL — AB (ref 13.0–18.0)
MCH: 30.2 pg (ref 26.0–34.0)
MCHC: 34.5 g/dL (ref 32.0–36.0)
MCV: 87.4 fL (ref 80.0–100.0)
Platelets: 240 10*3/uL (ref 150–440)
RBC: 3.87 MIL/uL — ABNORMAL LOW (ref 4.40–5.90)
RDW: 14.1 % (ref 11.5–14.5)
WBC: 11.5 10*3/uL — AB (ref 3.8–10.6)

## 2017-09-17 LAB — BASIC METABOLIC PANEL
ANION GAP: 5 (ref 5–15)
BUN: 66 mg/dL — ABNORMAL HIGH (ref 6–20)
CHLORIDE: 109 mmol/L (ref 101–111)
CO2: 22 mmol/L (ref 22–32)
Calcium: 7.5 mg/dL — ABNORMAL LOW (ref 8.9–10.3)
Creatinine, Ser: 2.95 mg/dL — ABNORMAL HIGH (ref 0.61–1.24)
GFR calc Af Amer: 25 mL/min — ABNORMAL LOW (ref >60)
GFR, EST NON AFRICAN AMERICAN: 21 mL/min — AB (ref >60)
GLUCOSE: 97 mg/dL (ref 65–99)
POTASSIUM: 3.9 mmol/L (ref 3.5–5.1)
Sodium: 136 mmol/L (ref 135–145)

## 2017-09-17 LAB — HEMOGLOBIN A1C
Hgb A1c MFr Bld: 11.8 % — ABNORMAL HIGH (ref 4.8–5.6)
MEAN PLASMA GLUCOSE: 291.96 mg/dL

## 2017-09-17 MED ORDER — TAMSULOSIN HCL 0.4 MG PO CAPS
0.4000 mg | ORAL_CAPSULE | Freq: Every day | ORAL | Status: DC
  Administered 2017-09-17 – 2017-09-18 (×2): 0.4 mg via ORAL
  Filled 2017-09-17 (×2): qty 1

## 2017-09-17 MED ORDER — INSULIN ASPART 100 UNIT/ML ~~LOC~~ SOLN
0.0000 [IU] | Freq: Three times a day (TID) | SUBCUTANEOUS | Status: DC
  Administered 2017-09-17: 1 [IU] via SUBCUTANEOUS
  Filled 2017-09-17: qty 1

## 2017-09-17 MED ORDER — AMLODIPINE BESYLATE 10 MG PO TABS
10.0000 mg | ORAL_TABLET | Freq: Every day | ORAL | Status: DC
  Administered 2017-09-17 – 2017-09-18 (×2): 10 mg via ORAL
  Filled 2017-09-17 (×2): qty 1

## 2017-09-17 MED ORDER — INSULIN ASPART 100 UNIT/ML ~~LOC~~ SOLN
3.0000 [IU] | Freq: Three times a day (TID) | SUBCUTANEOUS | Status: DC
  Administered 2017-09-17 – 2017-09-18 (×2): 3 [IU] via SUBCUTANEOUS
  Filled 2017-09-17: qty 1

## 2017-09-17 MED ORDER — AMLODIPINE BESYLATE 10 MG PO TABS: 10.0000 mg | ORAL_TABLET | Freq: Every day | ORAL | Status: DC

## 2017-09-17 MED ORDER — DOCUSATE SODIUM 100 MG PO CAPS
100.0000 mg | ORAL_CAPSULE | Freq: Two times a day (BID) | ORAL | Status: DC
  Filled 2017-09-17: qty 1

## 2017-09-17 MED ORDER — INSULIN GLARGINE 100 UNIT/ML ~~LOC~~ SOLN
15.0000 [IU] | Freq: Every day | SUBCUTANEOUS | Status: DC
  Filled 2017-09-17 (×3): qty 0.15

## 2017-09-17 MED ORDER — HYDRALAZINE HCL 50 MG PO TABS
50.0000 mg | ORAL_TABLET | Freq: Three times a day (TID) | ORAL | Status: DC
  Administered 2017-09-17 – 2017-09-18 (×3): 50 mg via ORAL
  Filled 2017-09-17 (×3): qty 1

## 2017-09-17 MED ORDER — LORATADINE 10 MG PO TABS
10.0000 mg | ORAL_TABLET | Freq: Every day | ORAL | Status: DC
  Administered 2017-09-17 – 2017-09-18 (×2): 10 mg via ORAL
  Filled 2017-09-17 (×2): qty 1

## 2017-09-17 MED ORDER — LIVING WELL WITH DIABETES BOOK
Freq: Once | Status: AC
  Administered 2017-09-17: 11:00:00
  Filled 2017-09-17: qty 1

## 2017-09-17 MED ORDER — SODIUM BICARBONATE 650 MG PO TABS
650.0000 mg | ORAL_TABLET | Freq: Every day | ORAL | Status: DC
  Administered 2017-09-17 – 2017-09-18 (×2): 650 mg via ORAL
  Filled 2017-09-17 (×2): qty 1

## 2017-09-17 NOTE — Progress Notes (Addendum)
Inpatient Diabetes Program Recommendations  AACE/ADA: New Consensus Statement on Inpatient Glycemic Control (2015)  Target Ranges:  Prepandial:   less than 140 mg/dL      Peak postprandial:   less than 180 mg/dL (1-2 hours)      Critically ill patients:  140 - 180 mg/dL   Lab Results  Component Value Date   GLUCAP 103 (H) 09/17/2017   HGBA1C 11.8 (H) 09/16/2017    Review of Glycemic Control Results for Ruben Ward, Ruben Ward (MRN [id number]) as of 09/17/2017 10:05  Ref. Range 09/15/2017 13:24 09/16/2017 18:23 09/16/2017 22:12 09/17/2017 07:27 09/17/2017 08:26  Glucose-Capillary Latest Ref Range: 65 - 99 mg/dL 086 (H) 578 (H) 469 (H) 50 (L) 103 (H)   Diabetes history: DM2 Outpatient Diabetes medications: Tresiba 15 units q hs + Amaryl 4 mg bid + Actos + Victoza qd Current orders for Inpatient glycemic control: Lantus 26 units + Novolog moderate correction tid + hs 0-5 units  Inpatient Diabetes Program Recommendations: Due to elevated renal labs: -Decrease Lantus to 15 -Add Novolog 3 units tid meal coverage if eats 50% -Decrease Novolog correction to sensitive    Spoke with patient @ bedside to verify medications and dosages. Patient states he hasn't been on [place] very long and had recently been on Lantus and changed to [place]. Patient states he has been taking his medications but was taking [place] 13 units daily. Patient was seen by Dr. Letitia Libra on 09/16/17 and has medications listed. A1c was 11.6 on 07/21/17. Reviewed A1c is currently 11.8 (average blood glucose 292 over the past 2-3 weeks). Addressed basic home care, basic diabetes diet nutrition principles, importance of checking CBGs and maintaining good CBG control to prevent long-term and short-term complications. Reviewed signs and symptoms of hyperglycemia and hypoglycemia and how to treat hypoglycemia at home. Also reviewed blood sugar goals at home.  RNs to provide ongoing basic DM education at bedside with this patient. Have ordered  educational booklet.  Thank you, Billy Fischer. Edmond Ginsberg, RN, MSN, CDE  Diabetes Coordinator Inpatient Glycemic Control Team Team Pager [phone] (8am-5pm) 09/17/2017 10:17 AM

## 2017-09-17 NOTE — Progress Notes (Signed)
Chaplain was called to complete an AD. The patient and family were well-versed in ADs and wanted to complete the document before the patient was discharged. Chaplain provided education and offered to return for completion when the family was ready.

## 2017-09-17 NOTE — Progress Notes (Signed)
[facility] [place] at [facility]   PATIENT NAME: Ruben Ward    MR#:  [id number]  DATE OF BIRTH:  1955-06-08  SUBJECTIVE:  CHIEF COMPLAINT:  No chief complaint on file.    Patient came with hypoglycemia, received repeated NovoLog and Lantus dose in the evening and was hypoglycemic today morning.  He denies any complaints. He denies any recent change in his diabetic medications.  REVIEW OF SYSTEMS:   CONSTITUTIONAL: No fever, fatigue or weakness.  EYES: No blurred or double vision.  EARS, NOSE, AND THROAT: No tinnitus or ear pain.  RESPIRATORY: No cough, shortness of breath, wheezing or hemoptysis.  CARDIOVASCULAR: No chest pain, orthopnea, edema.  GASTROINTESTINAL: No nausea, vomiting, diarrhea or abdominal pain.  GENITOURINARY: No dysuria, hematuria.  ENDOCRINE: No polyuria, nocturia,  HEMATOLOGY: No anemia, easy bruising or bleeding SKIN: No rash or lesion. MUSCULOSKELETAL: No joint pain or arthritis.   NEUROLOGIC: No tingling, numbness, weakness.  PSYCHIATRY: No anxiety or depression.   ROS  DRUG ALLERGIES:   Allergies  Allergen Reactions  . Glucophage [Metformin]   . Metformin And Related Other (See Comments)    Renal problems Doctor Solum said that kidney function was ok to take metformin currently  . Metformin And Related     Renal failure    VITALS:  Blood pressure (!) 164/72, pulse 68, temperature 98.1 F (36.7 C), temperature source Oral, resp. rate 20, height  (1.676 m), weight (!) 142.4 kg (314 lb), SpO2 97 %.  PHYSICAL EXAMINATION:  GENERAL:  62 y.o.-year-old patient lying in the bed with no acute distress.  EYES: Pupils equal, round, reactive to light and accommodation. No scleral icterus. Extraocular muscles intact.  HEENT: Head atraumatic, normocephalic. Oropharynx and nasopharynx clear.  NECK:  Supple, no jugular venous distention. No thyroid enlargement, no tenderness.  LUNGS: Normal breath sounds bilaterally, no  wheezing, rales,rhonchi or crepitation. No use of accessory muscles of respiration.  CARDIOVASCULAR: S1, S2 normal. No murmurs, rubs, or gallops.  ABDOMEN: Soft, nontender, nondistended. Bowel sounds present. No organomegaly or mass.  EXTREMITIES: No pedal edema, cyanosis, or clubbing.  NEUROLOGIC: Cranial nerves II through XII are intact. Muscle strength 5/5 in all extremities. Sensation intact. Gait not checked.  PSYCHIATRIC: The patient is alert and oriented x 3.  SKIN: No obvious rash, lesion, or ulcer.   Physical Exam LABORATORY PANEL:   CBC Recent Labs  Lab 09/17/17 0357  WBC 11.5*  HGB 11.7*  HCT 33.8*  PLT 240   ------------------------------------------------------------------------------------------------------------------  Chemistries  Recent Labs  Lab 09/16/17 1811 09/17/17 0357  NA 130* 136  K 4.8 3.9  CL 102 109  CO2 18* 22  GLUCOSE 530* 97  BUN 70* 66*  CREATININE 3.14* 2.95*  CALCIUM 7.5* 7.5*  AST 33  --   ALT 34  --   ALKPHOS 119  --   BILITOT 0.4  --    ------------------------------------------------------------------------------------------------------------------  Cardiac Enzymes No results for input(s): TROPONINI in the last 168 hours. ------------------------------------------------------------------------------------------------------------------  RADIOLOGY:  No results found.  ASSESSMENT AND PLAN:   Active Problems:   Hyperglycemia  * Hyperglycemia now hypoglycemia  SSI and start lantus Checked Hb A1c- high IVF  initially given high-dose Lantus and NovoLog, now decreased the dose due to hypoglycemia.  Hold oral pioglitazone because of renal failure.  * CKD4 Monitor   Stable.  * HTN Continue home meds  * DVT prophylaxis Heparin SQ     All the records are reviewed and case discussed with  Care Management/Social Workerr. Management plans discussed with the patient, family and they are in agreement.  CODE STATUS:  Full.  TOTAL TIME TAKING CARE OF THIS PATIENT: 35 minutes.   Discussed with his sister in the room.  POSSIBLE D/C IN 1-2 DAYS, DEPENDING ON CLINICAL CONDITION.   Altamese Dilling M.D on 09/17/2017   Between 7am to 6pm - Pager - [phone]  After 6pm go to [email or web address] - password [email or web address]  [facility] [place] [facility]  Office  [phone]  CC: Primary care physician; Gracelyn Nurse, MD  Note: This dictation was prepared with Dragon dictation along with smaller phrase technology. Any transcriptional errors that result from this process are unintentional.

## 2017-09-18 LAB — BASIC METABOLIC PANEL
Anion gap: 6 (ref 5–15)
BUN: 53 mg/dL — ABNORMAL HIGH (ref 6–20)
CHLORIDE: 109 mmol/L (ref 101–111)
CO2: 22 mmol/L (ref 22–32)
Calcium: 7.5 mg/dL — ABNORMAL LOW (ref 8.9–10.3)
Creatinine, Ser: 2.54 mg/dL — ABNORMAL HIGH (ref 0.61–1.24)
GFR calc Af Amer: 30 mL/min — ABNORMAL LOW (ref >60)
GFR, EST NON AFRICAN AMERICAN: 26 mL/min — AB (ref >60)
GLUCOSE: 76 mg/dL (ref 65–99)
POTASSIUM: 4.3 mmol/L (ref 3.5–5.1)
Sodium: 137 mmol/L (ref 135–145)

## 2017-09-18 LAB — GLUCOSE, CAPILLARY: Glucose-Capillary: 101 mg/dL — ABNORMAL HIGH (ref 65–99)

## 2017-09-18 NOTE — Progress Notes (Signed)
Pt is being discharged today, discharge instructions were reviewed with the patient and he verified understanding. IV X1 was removed, all belongings were packed and returned to the patient. 0 prescriptions were given to him. Patient is currently waiting on his ride to arrive and then will be rolled out in a wheelchair by staff.

## 2017-09-18 NOTE — Discharge Summary (Signed)
[facility] - [place] at [facility]   PATIENT NAME: Ruben Ward    MR#:  [id number]  DATE OF BIRTH:  December 01, 1955  DATE OF ADMISSION:  09/16/2017   ADMITTING PHYSICIAN: Milagros Loll, MD  DATE OF DISCHARGE: 09/18/2017 11:53 AM  PRIMARY CARE PHYSICIAN: Gracelyn Nurse, MD   ADMISSION DIAGNOSIS:  hyperglycemia DISCHARGE DIAGNOSIS:  Active Problems:   Hyperglycemia  SECONDARY DIAGNOSIS:   Past Medical History:  Diagnosis Date  . AKI (acute kidney injury) (HCC) 11/29/2015  . Arthropathy of shoulder region 01/05/2012  . Asthma   . Back pain 12/26/2012  . 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 Cherylann Ratel), proteinuria  . Closed fracture of metatarsal bone 12/10/2016  . Complication of anesthesia   . Diabetes mellitus (HCC) 08/19/2016  . Diabetes mellitus without complication (HCC)   . Difficult intubation   . Endocarditis   . History of cardiovascular disorder 02/12/2014  . Hypercholesterolemia 08/19/2016  . Hyperlipemia   . Hypertension   . Incomplete emptying of bladder 09/30/2016  . Iron deficiency anemia 01/29/2014  . Knee pain 08/24/2016  . Morbid obesity (HCC) 02/12/2014  . Obstructive sleep apnea syndrome 08/19/2016  . Osteoarthritis of knee 03/17/2017  . Personal history of other diseases of the circulatory system 02/12/2014  . 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 Sleep apnea 02/12/2014   Overview:  Overview:  Overview:  on BiPAP on BiPAP  . Sprain of foot 12/27/2015  . 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 Umbilical hernia 08/24/2016  . Urethral stricture 08/12/2014   Overview:  IMOUPDATE   HOSPITAL COURSE:   Active Problems:   Hyperglycemia and hypoglycemia.  Diabetes 2. Improved with lanuts and SL.  But she developed hypoglycemia.  Lantus dose was reduced to 15 units at bedtime.  He need to follow-up with his primary care physician to adjust home Victoza and Actos.  He is also following up endocrinologist Dr. Tedd Sias.  *Hyperglycemia now hypoglycemia  * CKD4 Monitor   Stable.  * HTN Continue home meds  DISCHARGE CONDITIONS:  Stable, discharged to home today. CONSULTS OBTAINED:   DRUG ALLERGIES:   Allergies  Allergen Reactions  . Glucophage [Metformin]   . Metformin And Related Other (See Comments)    Renal problems Doctor Solum said that kidney function was ok to take metformin currently  . Metformin And Related     Renal failure   DISCHARGE MEDICATIONS:   Allergies as of 09/18/2017      Reactions   Glucophage [metformin]    Metformin And Related Other (See Comments)   Renal problems Doctor Solum said that kidney function was ok to take metformin currently   Metformin And Related    Renal failure      Medication List    STOP taking these medications   labetalol 100 MG tablet Commonly known as:  NORMODYNE     TAKE these medications   ACTOS PO Take 1 tablet by mouth daily.   amLODipine 10 MG tablet Commonly known as:  NORVASC Take 10 mg by mouth daily.   benzonatate 100 MG capsule Commonly known as:  TESSALON PERLES Take 2 capsules (200 mg total) by mouth 3 (three) times daily.   cetirizine 10 MG tablet Commonly known as:  ZYRTEC Take 10 mg by mouth daily.   docusate sodium 100 MG capsule Commonly known as:  COLACE Take 1 capsule (100 mg total) by mouth 2 (two) times daily.   enalapril 20 MG tablet Commonly known as:  VASOTEC Take 20 mg by mouth 2 (two) times daily.   hydrALAZINE 100 MG tablet Commonly known as:  APRESOLINE Take 100 mg by mouth 2 (two) times daily.   LASIX PO Take 1 tablet by mouth daily.   liraglutide 18 MG/3ML Sopn Commonly known as:  VICTOZA Inject 0.2 mLs (1.2 mg total) into the skin daily.   sodium bicarbonate 650  MG tablet Take 650 mg by mouth daily.   tamsulosin 0.4 MG Caps capsule Commonly known as:  FLOMAX Take 1 capsule (0.4 mg total) by mouth daily.   VELTASSA 16.8 g Pack Generic drug:  Patiromer Sorbitex Calcium MIX AND DIS 1 PACKET IN WATER AND DRINK PO ONCE D.        DISCHARGE INSTRUCTIONS:  See AVS.  If you experience worsening of your admission symptoms, develop shortness of breath, life threatening emergency, suicidal or homicidal thoughts you must seek medical attention immediately by calling 911 or calling your MD immediately  if symptoms less severe.  You Must read complete instructions/literature along with all the possible adverse reactions/side effects for all the Medicines you take and that have been prescribed to you. Take any new Medicines after you have completely understood and accpet all the possible adverse reactions/side effects.   Please note  You were cared for by a hospitalist during your hospital stay. If you have any questions about your discharge medications or the care you received while you were in the hospital after you are discharged, you can call the unit and asked to speak with the hospitalist on call if the hospitalist that took care of you is not available. Once you are discharged, your primary care physician will handle any further medical issues. Please note that NO REFILLS for any discharge medications will be authorized once you are discharged, as it is imperative that you return to your primary care physician (or establish a relationship with a primary care physician if you do not have one) for your aftercare needs so that they can reassess your need for medications and monitor your lab values.    On the day of Discharge:  VITAL SIGNS:  Blood pressure 138/63, pulse 70, temperature 97.6 F (36.4 C), temperature source Oral, resp. rate (!) 22, height  (1.676 m), weight (!) 312 lb 9.6 oz (141.8 kg), SpO2 97 %. PHYSICAL EXAMINATION:  GENERAL:  62  y.o.-year-old patient lying in the bed with no acute distress.  EYES: Pupils equal, round, reactive to light and accommodation. No scleral icterus. Extraocular muscles intact.  HEENT: Head atraumatic, normocephalic. Oropharynx and nasopharynx clear.  NECK:  Supple, no jugular venous distention. No thyroid enlargement, no tenderness.  LUNGS: Normal breath sounds bilaterally, no wheezing, rales,rhonchi or crepitation. No use of accessory muscles of respiration.  CARDIOVASCULAR: S1, S2 normal. No murmurs, rubs, or gallops.  ABDOMEN: Soft, non-tender, non-distended. Bowel sounds present. No organomegaly or mass.  EXTREMITIES: No pedal edema, cyanosis, or clubbing.  NEUROLOGIC: Cranial nerves II through XII are intact. Muscle strength 5/5 in all extremities. Sensation intact. Gait not checked.  PSYCHIATRIC: The patient is alert and oriented x 3.  SKIN: No obvious rash, lesion, or ulcer.  DATA REVIEW:   CBC Recent Labs  Lab 09/17/17 0357  WBC 11.5*  HGB 11.7*  HCT 33.8*  PLT 240    Chemistries  Recent Labs  Lab 09/16/17 1811  09/18/17 0413  NA 130*   < > 137  K 4.8   < > 4.3  CL 102   < > 109  CO2 18*   < > 22  GLUCOSE 530*   < > 76  BUN 70*   < > 53*  CREATININE 3.14*   < > 2.54*  CALCIUM 7.5*   < > 7.5*  AST 33  --   --   ALT 34  --   --   ALKPHOS 119  --   --   BILITOT 0.4  --   --    < > = values in this interval not displayed.     Microbiology Results  Results for orders placed or performed in visit on 03/17/17  Microscopic Examination     Status: Abnormal   Collection Time: 03/17/17  9:34 AM  Result Value Ref Range Status   WBC, UA 0-5 0 - 5 /hpf Final   RBC, UA 0-2 0 - 2 /hpf Final   Epithelial Cells (non renal) None seen 0 - 10 /hpf Final   Mucus, UA Present (A) Not Estab. Final   Bacteria, UA Few (A) None seen/Few Final    RADIOLOGY:  No results found.   Management plans discussed with the patient, family and they are in agreement.  CODE STATUS: Prior     TOTAL TIME TAKING CARE OF THIS PATIENT: 32 minutes.    Shaune Pollack M.D on 09/18/2017 at 4:45 PM  Between 7am to 6pm - Pager - [phone]  After 6pm go to [email or web address] - Social research officer, government  [facility] [place] [facility]  Office  [phone]  CC: Primary care physician; Gracelyn Nurse, MD   Note: This dictation was prepared with Dragon dictation along with smaller phrase technology. Any transcriptional errors that result from this process are unintentional.

## 2017-09-18 NOTE — Discharge Instructions (Signed)
Heart healthy and ADA diet. °

## 2017-09-21 ENCOUNTER — Telehealth: Payer: Self-pay

## 2017-09-21 NOTE — Telephone Encounter (Signed)
EMMI Follow-up: Noted on the report that patient had a question about follow-up appointment.  Left a VM for the patient to call me at his convenience.

## 2017-12-16 IMAGING — DX DG CHEST 1V
1 series · 1 of 1 positions shown · non-contrast
Comparison: None.

CLINICAL DATA: 61-year-old male with choking sensation.

EXAM:
CHEST 1 VIEW

[chest ap]
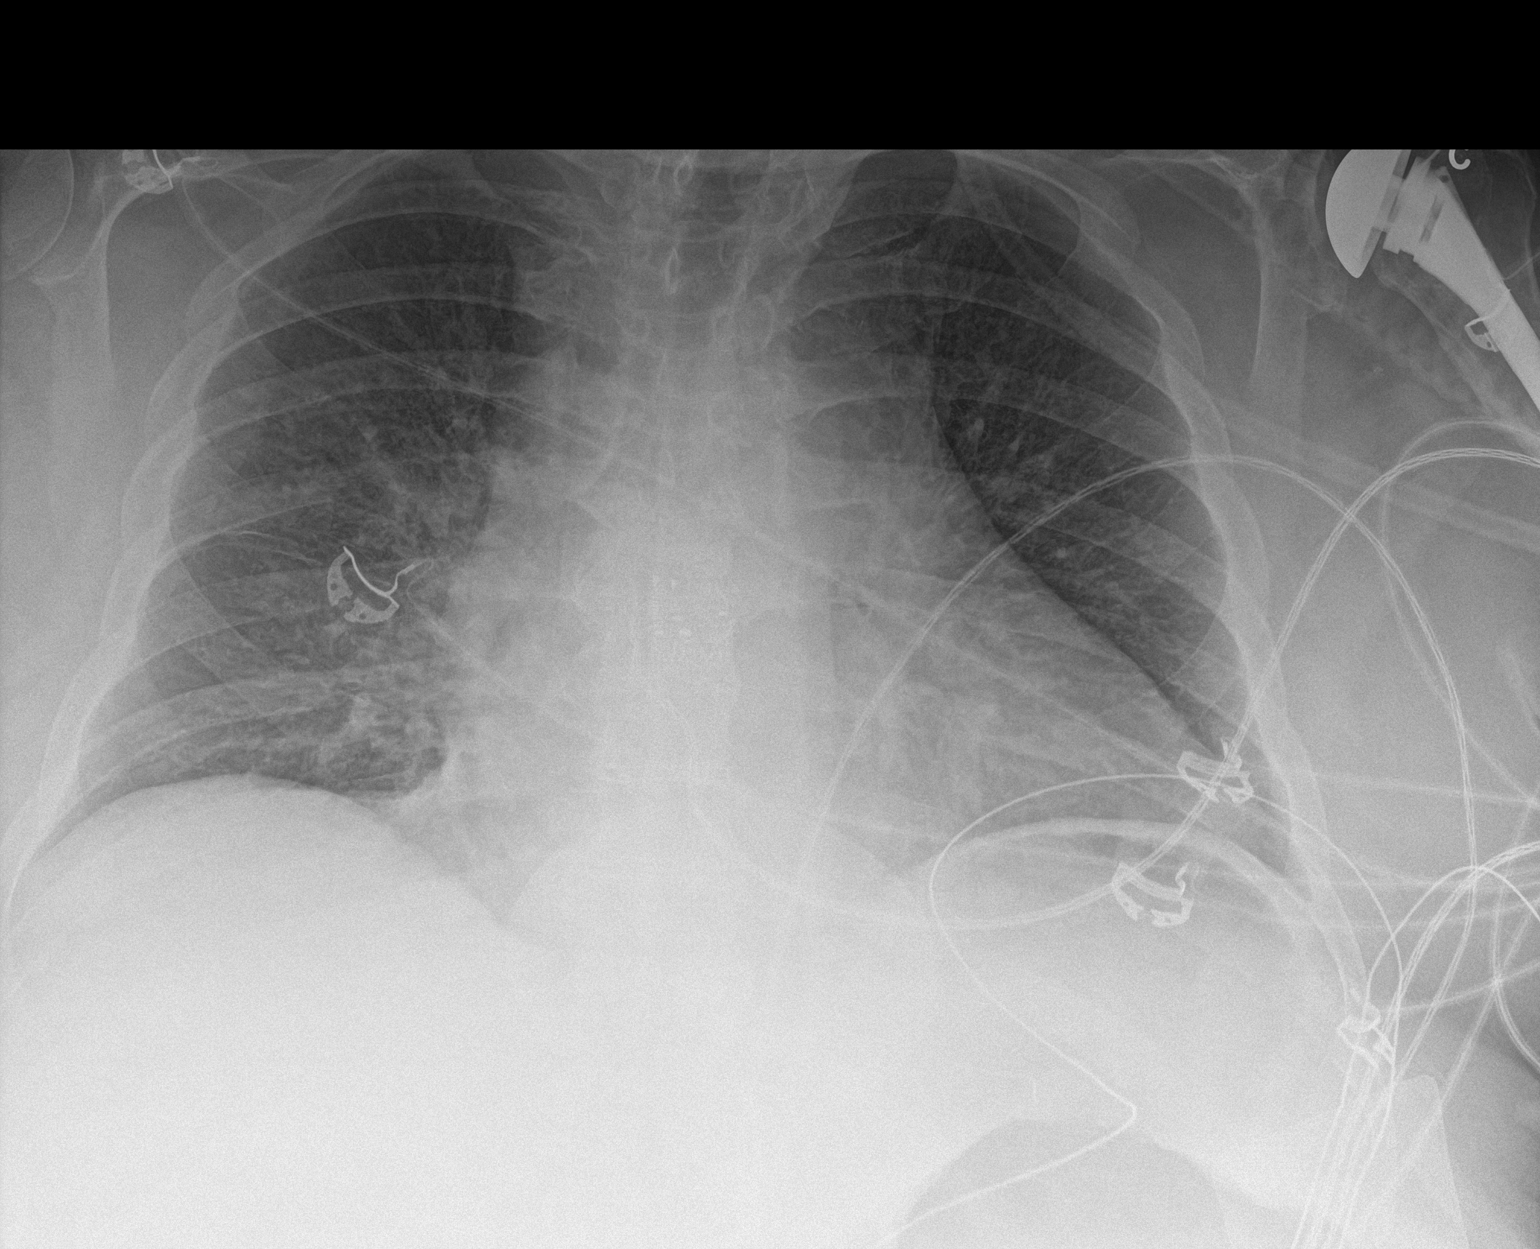

[1 of 1 positions shown; findings below may reference images not displayed]

FINDINGS: There is mild cardiomegaly with mild vascular congestion. No focal
consolidation, pleural effusion, or pneumothorax. Left shoulder
arthroplasty. No acute osseous pathology.
IMPRESSION: Cardiomegaly with mild vascular congestion.  No focal consolidation.

## 2017-12-16 DEATH — deceased

## 2018-03-16 ENCOUNTER — Ambulatory Visit: Payer: Medicare Other | Admitting: Urology

## 2018-03-16 ENCOUNTER — Encounter: Payer: Self-pay | Admitting: Urology
